# Patient Record
Sex: Female | Born: 1995
Health system: Southern US, Community
[De-identification: ages and names within clinical notes are randomized; demographics above are authoritative.]

## PROBLEM LIST (undated history)

## (undated) DIAGNOSIS — A599 Trichomoniasis, unspecified: Secondary | ICD-10-CM

## (undated) DIAGNOSIS — Z789 Other specified health status: Secondary | ICD-10-CM

## (undated) HISTORY — PX: NO PAST SURGERIES: SHX2092

---
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
## 2019-06-13 NOTE — L&D Delivery Note (Signed)
Delivery Note At 6:08 AM a viable female was delivered via Vaginal, Spontaneous (Presentation: Middle Occiput Posterior).  No nuchal cord noted. APGAR: 5, 7; weight 6 lb 6.6 oz (2909 g).   Placenta status: Spontaneous, Intact.  Cord: 3 vessels with the following complications: None.   Anesthesia: Epidural Episiotomy: None Lacerations: 1st degree Suture Repair: 3.0 Est. Blood Loss (mL): 100  Mom to postpartum.  Baby to Couplet care / Skin to Skin.  [name] 05/10/2020, 8:05 PM

## 2019-10-03 ENCOUNTER — Other Ambulatory Visit: Payer: Self-pay

## 2019-10-03 ENCOUNTER — Emergency Department (HOSPITAL_COMMUNITY)
Admission: EM | Admit: 2019-10-03 | Discharge: 2019-10-03 | Disposition: A | Discharge status: Home / Self Care | Payer: Self-pay | Attending: Emergency Medicine | Admitting: Emergency Medicine

## 2019-10-03 ENCOUNTER — Encounter (HOSPITAL_COMMUNITY): Payer: Self-pay

## 2019-10-03 DIAGNOSIS — O219 Vomiting of pregnancy, unspecified: Secondary | ICD-10-CM | POA: Insufficient documentation

## 2019-10-03 DIAGNOSIS — Z5321 Procedure and treatment not carried out due to patient leaving prior to being seen by health care provider: Secondary | ICD-10-CM | POA: Insufficient documentation

## 2019-10-03 DIAGNOSIS — Z3A00 Weeks of gestation of pregnancy not specified: Secondary | ICD-10-CM | POA: Insufficient documentation

## 2019-10-03 MED ORDER — SODIUM CHLORIDE 0.9% FLUSH: 3.0000 mL | Freq: Once | INTRAVENOUS | Status: DC

## 2019-10-03 NOTE — ED Triage Notes (Signed)
Patient states she has been vomiting x 1 month. Patient states she has lost 20 lbs in 9 days. Patient states she has not had a home pregnancy test and states she knows she is pregnant, but has not seen an OB/GYN.

## 2019-10-04 ENCOUNTER — Other Ambulatory Visit: Payer: Self-pay

## 2019-10-04 ENCOUNTER — Emergency Department (HOSPITAL_COMMUNITY)
Admission: EM | Admit: 2019-10-04 | Discharge: 2019-10-04 | Disposition: A | Discharge status: Home / Self Care | Payer: Self-pay | Attending: Emergency Medicine | Admitting: Emergency Medicine

## 2019-10-04 ENCOUNTER — Emergency Department (HOSPITAL_COMMUNITY): Payer: Self-pay

## 2019-10-04 ENCOUNTER — Encounter (HOSPITAL_COMMUNITY): Payer: Self-pay

## 2019-10-04 DIAGNOSIS — Z3A09 9 weeks gestation of pregnancy: Secondary | ICD-10-CM | POA: Insufficient documentation

## 2019-10-04 DIAGNOSIS — N898 Other specified noninflammatory disorders of vagina: Secondary | ICD-10-CM | POA: Insufficient documentation

## 2019-10-04 DIAGNOSIS — O219 Vomiting of pregnancy, unspecified: Secondary | ICD-10-CM | POA: Insufficient documentation

## 2019-10-04 DIAGNOSIS — R109 Unspecified abdominal pain: Secondary | ICD-10-CM | POA: Insufficient documentation

## 2019-10-04 LAB — CBC
HCT: 44 % (ref 36.0–46.0)
Hemoglobin: 15 g/dL (ref 12.0–15.0)
MCH: 31.6 pg (ref 26.0–34.0)
MCHC: 34.1 g/dL (ref 30.0–36.0)
MCV: 92.8 fL (ref 80.0–100.0)
Platelets: 288 10*3/uL (ref 150–400)
RBC: 4.74 MIL/uL (ref 3.87–5.11)
RDW: 11.8 % (ref 11.5–15.5)
WBC: 7.8 10*3/uL (ref 4.0–10.5)
nRBC: 0 % (ref 0.0–0.2)

## 2019-10-04 LAB — URINALYSIS, ROUTINE W REFLEX MICROSCOPIC
Bilirubin Urine: NEGATIVE
Glucose, UA: NEGATIVE mg/dL
Hgb urine dipstick: NEGATIVE
Ketones, ur: 20 mg/dL — AB
Nitrite: NEGATIVE
Protein, ur: 100 mg/dL — AB
Specific Gravity, Urine: 1.033 — ABNORMAL HIGH (ref 1.005–1.030)
Squamous Epithelial / HPF: >50 — ABNORMAL HIGH (ref 0–5)
pH: 6 (ref 5.0–8.0)

## 2019-10-04 LAB — COMPREHENSIVE METABOLIC PANEL
ALT: 24 U/L (ref 0–44)
AST: 23 U/L (ref 15–41)
Albumin: 4.2 g/dL (ref 3.5–5.0)
Alkaline Phosphatase: 66 U/L (ref 38–126)
Anion gap: 9 (ref 5–15)
BUN: 8 mg/dL (ref 6–20)
CO2: 23 mmol/L (ref 22–32)
Calcium: 9.4 mg/dL (ref 8.9–10.3)
Chloride: 102 mmol/L (ref 98–111)
Creatinine, Ser: 0.67 mg/dL (ref 0.44–1.00)
GFR calc Af Amer: >60 mL/min (ref >60)
GFR calc non Af Amer: >60 mL/min (ref >60)
Glucose, Bld: 101 mg/dL — ABNORMAL HIGH (ref 70–99)
Potassium: 3.3 mmol/L — ABNORMAL LOW (ref 3.5–5.1)
Sodium: 134 mmol/L — ABNORMAL LOW (ref 135–145)
Total Bilirubin: 1.5 mg/dL — ABNORMAL HIGH (ref 0.3–1.2)
Total Protein: 8.2 g/dL — ABNORMAL HIGH (ref 6.5–8.1)

## 2019-10-04 LAB — WET PREP, GENITAL
Sperm: NONE SEEN
Trich, Wet Prep: NONE SEEN
Yeast Wet Prep HPF POC: NONE SEEN

## 2019-10-04 LAB — LIPASE, BLOOD: Lipase: 26 U/L (ref 11–51)

## 2019-10-04 LAB — I-STAT BETA HCG BLOOD, ED (MC, WL, AP ONLY): I-stat hCG, quantitative: >2000 m[IU]/mL — ABNORMAL HIGH (ref <5)

## 2019-10-04 MED ORDER — SODIUM CHLORIDE 0.9% FLUSH: 3.0000 mL | Freq: Once | INTRAVENOUS | Status: DC

## 2019-10-04 MED ORDER — POTASSIUM CHLORIDE CRYS ER 20 MEQ PO TBCR
40.0000 meq | EXTENDED_RELEASE_TABLET | Freq: Once | ORAL | Status: AC
  Administered 2019-10-04: 40 meq via ORAL
  Filled 2019-10-04: qty 2

## 2019-10-04 MED ORDER — METOCLOPRAMIDE HCL 5 MG/ML IJ SOLN
10.0000 mg | Freq: Once | INTRAMUSCULAR | Status: AC
  Administered 2019-10-04: 10 mg via INTRAVENOUS
  Filled 2019-10-04: qty 2

## 2019-10-04 MED ORDER — DIPHENHYDRAMINE HCL 50 MG/ML IJ SOLN
25.0000 mg | Freq: Once | INTRAMUSCULAR | Status: AC
  Administered 2019-10-04: 25 mg via INTRAVENOUS
  Filled 2019-10-04: qty 1

## 2019-10-04 MED ORDER — SODIUM CHLORIDE 0.9 % IV BOLUS
1000.0000 mL | Freq: Once | INTRAVENOUS | Status: AC
  Administered 2019-10-04: 1000 mL via INTRAVENOUS

## 2019-10-04 MED ORDER — ONDANSETRON 4 MG PO TBDP: 4.0000 mg | ORAL_TABLET | Freq: Once | ORAL | Status: DC | PRN

## 2019-10-04 NOTE — ED Triage Notes (Signed)
Patient reports she is pregnant, but unsure how far along, does not remember lmp dates. Patient states she found out at the beginning of this month about pregnancy and has been throwing up every day since. Patient states she is unable to keep anything down.

## 2019-10-04 NOTE — ED Provider Notes (Signed)
[facility] DEPT Provider Note   CSN: [id number] Arrival date & time: 10/04/19  4259     History Chief Complaint  Patient presents with  . Emesis During Pregnancy    Tiffany Fitzpatrick is a 24 y.o. female.  24 y.o female with no PMH presents to the ED with a chief complaint of emesis x 24 days. According to patient she's had several episodes of non biliuos, non bloody emesis for the past month, she was able to eat hard boiled eggs last night but these came up. She reports taking a pregnancy test last week which was positive. She reports her LMP was sometime around valentines day, but this is not out of the ordinary for her as her periods are irregular. She also endorses lower abdominal stabbing pain.  She has tried taking some OTC medication for nausea without improvement. No vaginal bleeding, no vaginal discharge, no urinary symptoms, no bowel problems.  Of note, patient does report smoking tobacco, no alcohol use. This is her first pregnancy.  The history is provided by the patient and medical records.  Emesis Severity:  Severe Duration:  24 days Timing:  Constant Number of daily episodes:  15 Able to tolerate:  Solids and liquids How soon after eating does vomiting occur:  5 minutes Progression:  Worsening Chronicity:  New Ineffective treatments:  Antiemetics and liquids Associated symptoms: abdominal pain   Associated symptoms: no diarrhea, no fever, no headaches and no sore throat   Risk factors: pregnant   Risk factors: no alcohol use        No past medical history on file.  There are no problems to display for this patient.   No past surgical history on file.   OB History    Gravida  1   Para      Term      Preterm      AB      Living        SAB      TAB      Ectopic      Multiple      Live Births              Family History  Family history unknown: Yes    Social History   Tobacco Use  .  Smoking status: Never Smoker  . Smokeless tobacco: Never Used  Substance Use Topics  . Alcohol use: Never  . Drug use: Yes    Types: Marijuana    Home Medications Prior to Admission medications   Not on File    Allergies    Patient has no known allergies.  Review of Systems   Review of Systems  Constitutional: Negative for fever.  HENT: Negative for sore throat.   Respiratory: Negative for shortness of breath.   Cardiovascular: Negative for chest pain.  Gastrointestinal: Positive for abdominal pain, nausea and vomiting. Negative for diarrhea.  Genitourinary: Negative for flank pain.  Musculoskeletal: Negative for back pain.  Skin: Negative for pallor and wound.  Neurological: Negative for light-headedness and headaches.    Physical Exam Updated Vital Signs BP 115/76   Pulse 81   Temp 99.3 F (37.4 C) (Oral)   Resp 16   Ht 5\' 5"  (1.651 m)   Wt 79.4 kg   LMP  (LMP Unknown)   SpO2 99%   BMI 29.12 kg/m   Physical Exam Vitals and nursing note reviewed. Exam conducted with a chaperone present.  Constitutional:  Appearance: Normal appearance.  HENT:     Head: Normocephalic and atraumatic.     Nose: Nose normal.     Mouth/Throat:     Mouth: Mucous membranes are moist.  Eyes:     Pupils: Pupils are equal, round, and reactive to light.  Cardiovascular:     Rate and Rhythm: Normal rate.  Pulmonary:     Effort: Pulmonary effort is normal.     Breath sounds: No wheezing or rales.  Abdominal:     General: Abdomen is flat.     Palpations: Abdomen is soft.     Tenderness: There is abdominal tenderness. There is no right CVA tenderness, left CVA tenderness, guarding or rebound.  Genitourinary:    Exam position: Supine.     Pubic Area: No rash or pubic lice.      Labia:        Right: No tenderness or lesion.        Left: No tenderness or lesion.      Comments: Chaperoned by Karma Ganja RN Thick white discharge in vaginal vault, foul odor noted. No strawberry cervix.   Musculoskeletal:     Cervical back: Normal range of motion and neck supple.  Skin:    General: Skin is warm and dry.  Neurological:     Mental Status: She is alert and oriented to person, place, and time.     Motor: Weakness:      ED Results / Procedures / Treatments   Labs (all labs ordered are listed, but only abnormal results are displayed) Labs Reviewed  WET PREP, GENITAL - Abnormal; Notable for the following components:      Result Value   Clue Cells Wet Prep HPF POC PRESENT (*)    WBC, Wet Prep HPF POC MANY (*)    All other components within normal limits  COMPREHENSIVE METABOLIC PANEL - Abnormal; Notable for the following components:   Sodium 134 (*)    Potassium 3.3 (*)    Glucose, Bld 101 (*)    Total Protein 8.2 (*)    Total Bilirubin 1.5 (*)    All other components within normal limits  URINALYSIS, ROUTINE W REFLEX MICROSCOPIC - Abnormal; Notable for the following components:   Color, Urine AMBER (*)    APPearance CLOUDY (*)    Specific Gravity, Urine 1.033 (*)    Ketones, ur 20 (*)    Protein, ur 100 (*)    Leukocytes,Ua LARGE (*)    Bacteria, UA MANY (*)    Squamous Epithelial / LPF >50 (*)    Non Squamous Epithelial 0-5 (*)    All other components within normal limits  I-STAT BETA HCG BLOOD, ED ([facility], [facility], [facility] ONLY) - Abnormal; Notable for the following components:   I-stat hCG, quantitative >2,000.0 (*)    All other components within normal limits  URINE CULTURE  LIPASE, BLOOD  CBC  GC/CHLAMYDIA PROBE AMP ([place]) NOT AT [facility]    EKG None  Radiology US OB Comp < 14 Wks  Result Date: 10/04/2019 CLINICAL DATA:  Rule out ectopic pregnancy. Pregnant patient with pain. EXAM: OBSTETRIC <14 WK ULTRASOUND TECHNIQUE: Transabdominal ultrasound was performed for evaluation of the gestation as well as the maternal uterus and adnexal regions. COMPARISON:  None. FINDINGS: Intrauterine gestational sac: Single Yolk sac:  Visualized. Embryo:  Visualized. Cardiac  Activity: Visualized. Heart Rate: 162 bpm MSD:    mm    w     d CRL:   28.7 mm  9 w 5 d                  [place] [facility]: May 03, 2020 Subchorionic hemorrhage:  There is a small subchorionic hemorrhage. Maternal uterus/adnexae: The left ovary is normal. There is a thick walled cyst in the right ovary. IMPRESSION: 1. Single live IUP. 2. There is a thick walled 1.9 cm cyst in the right ovary, likely a corpus luteum cyst or hemorrhagic cyst given the presence of an IUP. Electronically Signed   By: Gerome Sam III M.D   On: 10/04/2019 14:08    Procedures Procedures (including critical care time)  Medications Ordered in ED Medications  metoCLOPramide (REGLAN) injection 10 mg (10 mg Intravenous Given 10/04/19 1148)  diphenhydrAMINE (BENADRYL) injection 25 mg (25 mg Intravenous Given 10/04/19 1148)  sodium chloride 0.9 % bolus 1,000 mL (0 mLs Intravenous Stopped 10/04/19 1415)  potassium chloride SA (KLOR-CON) CR tablet 40 mEq (40 mEq Oral Given 10/04/19 1412)    ED Course  I have reviewed the triage vital signs and the nursing notes.  Pertinent labs & imaging results that were available during my care of the patient were reviewed by me and considered in my medical decision making (see chart for details).  Clinical Course as of Oct 04 1522  Sat Oct 04, 2019  1229 Bacteria, UA(!): MANY [JS]  1229 Squamous Epithelial / LPF(!): >50 [JS]  1230 WBC, UA: 21-50 [JS]  1230 Leukocytes,Ua(!): LARGE [JS]  1504 Clue Cells Wet Prep HPF POC(!): PRESENT [JS]  1505 WBC, Wet Prep HPF POC(!): MANY [JS]    Clinical Course User Index [JS] Claude Manges, PA-C   MDM Rules/Calculators/A&P  Patient with no past medical history presents to the ED with complaints of nonbilious, nonbloody emesis for the past month.  Has taken a pregnancy test last week which was positive in nature.  Does not have any fever, urinary symptoms, vaginal bleeding, vaginal discharge.  Patient currently smoking tobacco, denies any alcohol use.   This is patient's first pregnancy, she has not had any OB care at this time.  Arrived in the ED with stable vital signs, slight elevation in heart rate, she is actively vomiting during our interview.  She has tried Gatorade along with over-the-counter medication without improvement in her symptoms. Last menstrual period sometime around Valentine's Day, reports this was normal for her as her periods tend to be abnormal and skip months.  Patient was given Reglan, Benadryl, IV fluids to help with symptomatic control.  CMP remarkable for some hypokalemia, likely coming from the multiple episodes of emesis.  Creatinine levels within normal limits.  LFTs are unremarkable.  CBC without any leukocytosis, hemoglobin is within normal limits with no signs of anemia.  UA is remarkable for large leukocytes, many bacteria, 21-50 white blood cell count, she does not have urinary symptoms on today's visit.  However, in the setting of pregnancy we will treat patient for UTI.  Wound culture has been sent, lipase is within normal limits.  Pelvic exam chaperoned with Carly RN at the bedside, thick white discharge noted on vaginal vault, foul odor noted.  No CMT, adnexal tenderness.  Ultrasound pelvis was ordered which showed: Single live IUP,9 w 5 d         [place] [facility]: May 03, 2020 Heartbeat around 160s, these results were discussed with patient at length.  A sample for GC along with wet prep was collected during pelvic exam.  Patient is awaiting results.  Nausea has resolved,  she has completed p.o. challenge successfully, is currently eating crackers and drinking Gatorade.  Wet prep remarkable for clue cells are present, many wbc. She is asymptomatic at this time. Will have patient follow up with OBGYN, a GC culture was sent patient is aware results will be back within 3 days. She was provided with a follow-up with women's health, numbers attached to her chart. She was also given instructions on B6 along with the  nighttime treatment to help with her nausea during pregnancy. Return precautions discussed at length. Patient stable for discharge.   Portions of this note were generated with Scientist, clinical (histocompatibility and immunogenetics). Dictation errors may occur despite best attempts at proofreading.  Final Clinical Impression(s) / ED Diagnoses Final diagnoses:  [redacted] weeks gestation of pregnancy  Nausea and vomiting during pregnancy    Rx / DC Orders ED Discharge Orders    None       Claude Manges, PA-C 10/04/19 1524    Benjiman Core, MD 10/05/19 1451

## 2019-10-04 NOTE — Discharge Instructions (Addendum)
According to your ultrasound you are 9 weeks and 5 days pregnant, with an estimated delivery date of May 03, 2020.   The results of your urine were contaminated we discussed waiting for treatment unless you develop symptoms.  A GC culture was sent, you will be called if this is positive.    You  may purchase some over the counter B6 (25 mg-50 mg) to help with nausea every 6 hours,MAX. 200 daily.   You may also purchase Unisom 25 mg tablets. Be aware unisom is a sleeping medication therefore should only be taken at night.

## 2019-10-04 NOTE — ED Notes (Signed)
Pt given water and crackers and able to keep them down without any difficulty.

## 2019-10-05 LAB — URINE CULTURE: Culture: NO GROWTH

## 2019-10-06 LAB — GC/CHLAMYDIA PROBE AMP (~~LOC~~) NOT AT ARMC
Chlamydia: NEGATIVE
Comment: NEGATIVE
Comment: NORMAL
Neisseria Gonorrhea: NEGATIVE

## 2019-11-19 ENCOUNTER — Ambulatory Visit (INDEPENDENT_AMBULATORY_CARE_PROVIDER_SITE_OTHER): Payer: Self-pay | Admitting: *Deleted

## 2019-11-19 ENCOUNTER — Other Ambulatory Visit: Payer: Self-pay

## 2019-11-19 DIAGNOSIS — F419 Anxiety disorder, unspecified: Secondary | ICD-10-CM

## 2019-11-19 DIAGNOSIS — Z3A00 Weeks of gestation of pregnancy not specified: Secondary | ICD-10-CM

## 2019-11-19 DIAGNOSIS — Z3490 Encounter for supervision of normal pregnancy, unspecified, unspecified trimester: Secondary | ICD-10-CM

## 2019-11-19 NOTE — Patient Instructions (Signed)
[place] [facility]  Department of Social Services[place] [address], [place], [place] [address] [phone]   or  www.[place].gov/[place]-county/human-services/social-services **SNAP/EBT/ Other nutritional benefits  [place] DHHS-Public Health-WIC [address], [place], [place] [address] [phone]) [phone]  or  https://[place]nc.gov/our-county/human-services/health-department **WIC for  women who are pregnant and postpartum, infants and children up to 5 years old  [facility] [address], [place], [place] [address] [phone]) [phone]   or   www.theblessedtable.org  **Food pantry  [facility] [address], [place], [place] [address] [phone]   or   https://brotherkolbes.godaddysites.com  **Emergency food and prepared meals  [facility] [address], [place], [place] [address] [phone]   or   www.cedargrovetop.us **Food pantry  [facility] [address], [place], [place] [address] [phone]   or   www.facebook.com/[facility]-116430931718202 **Food pantry  [facility] [address], [place], [place] [address] [phone] **Food pantry  [facility] [address], [place], [place] [address] [phone]) [phone]   or   www.greensborourbanministry.org  **Food pantry and prepared meals  [facility] [address], [place], [place] [address] https://jfsgreensboro.org/  **Food pantry  [facility] [address], [place], [place] [address] [phone]) [phone]   or   www.lbcnow.org  **Food pantry  [facility] [address], [place], [place] [address] [phone]) [phone]   or   http://www.onestepfurther.com **Food pantry, nutrition education, gardening activities  [facility] [address], [place], [place] [address] [phone] **Food pantry  [facility] [place] [address], [place], [place] [address] [phone]) [phone]   or   www.salvationarmyofgreensboro.org **Food pantry  [facility] [place] [address], [place], [place] [address] [phone]) [phone]   or   http://senior-resources-guilford.org **Meals on Wheels Program  [facility] [address], [place], [place] [address] [phone]) [phone]   or   www.stmattchurch.com  **Food pantry  [facility] [address], , [place] [address] [phone]   or   vandaliapresbyterianchurch.org **Food pantry  [place] [facility]  [facility] [facility] [address], [place], [place] [address] [phone]) [phone]   or   www.facebook.com/JulianUMC Food pantry  [facility] [address], [place], [place] [address] [phone]) [phone] **Food pantry  [place] [facility]   Department of Public Health[place] [address], [place], [place] [address] [phone]   or   www.co.Spencer.Boligee.us/ph/  [place] DHHS-Public Health-WIC ([place]) [address], [place], [place] [address] [phone]) [phone]   or   https://www.[place]nc.gov/our-county/human-services/health-department **WIC for pregnant and postpartum women, infants and children up to 5 years old  [facility] [address], [place], [place] [address] [phone]) [phone] **Food pantry  [facility] [address] [place] [address], [place], [place] [address] [phone]   or   emerywoodbaptistchurch.com *Food pantry  [facility] [address], [place], [place] [address] [phone]   or   www.fcchighpoint.org **Food pantry  [facility] [address], [place],  [address] [phone]) [phone]   or    www.helpinghandshp.org **Food pantry  [facility] [address], [place],  [address] [phone]) [phone]   or   www.facebook.com/KBCI1 **Food Pantry  [facility] [address] [place] [address] [facility]   [address] [place], [place] [address] [phone]   or   www.abbottscreek.org [employer] of [place] [phone]   **Delivers meals  New Beginnings Full [facility] [address], [place], [place] [address] [phone]   or   nbfgm.sundaystreamwebsites.com  Programme researcher, broadcasting/film/video of [employer] [address] [place], [place] [address] [phone]   or   www.odm-hp.org  **Food pantry  [place] [address], [place], [place] [address] [phone]   or   R2live.tv **Food pantry  [place] [address], [place], [place] [address] [phone]   or   ClubMonetize.fr **Emergency food and pet food  [facility] [place] [address], [place], [place] [address] [phone]   or   www.senioradults.org **Congregate and delivered meals to older adults  [place] [facility] [employer] [address], [place], [place] [address] [phone]   or   SamedayNews.com.cy **Back Pack Program for elementary school students  [place] [address], [place], [place] [address] [phone]   or   www.wardstreetcommunityresources.org **Food pantry  [facility] [address] [place], [place] [address] [phone]   or   https://www.carlson.net/ **Emergency food, nutrition classes, food budgeting  [facility] Resources [place]  [address] [place], [place], [place] [address] [phone]  or   www.co.[place].us/pview.aspx?id=14850&catid=407 **SNAP/Other nutrition benefits  [place], [place], [place] [address] [phone]   or  http://goodwin-walker.biz/  **SNAP/Other nutrition benefits  [place], [place], [place] [address] [phone]  or  http://www.rockinghamcountypublichealth.org **WIC for pregnant and postpartum women, infants and children up to 69 years old  Aging, Disability and [place] [address] [place], [place] [address] [phone]  or www.risodejaneiro.com **Prepared meals for older adults  [place] [address], [place], [place] [address] [phone]  or  NetworkingMixer.com.ee **Food pantry  [facility] [address], [place], [place] [address] [phone]   or   https://www.handsofgod.org/  Insurance underwriter  [facility] [place] [address], [place], [place] [address] [phone] **Food pantry  [facility] for [place], [place], [place] [address] [phone]   or   www.ci.[place].us/government/parks_and_recreation/senior_center/index.php **Congregate meal for older adults  [facility] [address] [place], [place] [address] [phone]   or www.reidsvilleoutreachcenter.org  **Food pantry  [facility] [address], [place], [place] [address] [phone]   or   http://reid-chang.com/ Games developer Resources [place], [place], [place] [address] https://www.[place].gov/departments/transportation/gdot-divisions/[place]-transit-agency-public-transportation-division     . Fixed-route bus services, including regional fare cards for PART, [place], [place], and WSTA buses.  . Reduced fare bus ID's available for Medicaid, Medicare, and "orange card"  recipients.  [place] [facility] SCAT offers curb-to-curb and door-to-door bus services for people with disabilities who are unable to use a fixed-bus route; also offers a shared-ride program.   Helpful tips:  -Routes available online and physical maps available at the main bus hub lobby (each for a specific route) -Smartphone directions often include bus routes (see the "bus" icon, next to the "car" and "walk" icons) -Routes differ on weekends, evenings and holidays,  so plan ahead!  -If you have Medicaid, Medicare, or orange card, plan to obtain a reduced-fare ID to save 50% on rides. Check days and times to obtain an ID, and bring all necessary documents.   [employer] System [place]) [address] [place], [place]. [address], [place], [place] [address] [phone] SpotApps.nl **Fixed-bus route services, and demand response bus service for older adults  Department of Social [facility] [address], [place], [place] [address] [phone] www.MysterySinger.com.cy **Medicaid transportation is available to [facility] recipients who need assistance getting to [facility] medical appointments and providers  [facility] [address], [place], [place] [address] www.cjmedicaltransportation.com  ** Offers non-emergency transportation for medical appointments  [address] [place], [place] [address] [phone] www.wheels4hope.org **REFERRAL NEEDED by specific agencies (see website), after meeting specified criteria only  [employer] for [employer]) [address], [place], [place] [address] [phone]  BuyingShow.es  *Regional fixed-bus routes between counties (example: [place] to [place]) and [employer] of [place]  [address], [place], [place] [address] [phone] Http://senior-resources-[place].org  Museum/gallery exhibitions officer available (call or see website for details)  Senior Adults Association[facility] [address] [place], [place] [address] [phone] www.senioradults.org  **Ride coordination    Transportation Services [place]  Aging, Disability and Transit [facility] [address] [place], [place] [address] [phone] www.adtsrc.[facility] Department of Health and [facility] [address], [place], [place] [address] [phone]  FutureSponsors.be  **Transportation expense assistance  Department of Social [place] [address], [place], [place] [address] [phone] www.co.[place].us/pview.aspx?id=14850&catid=407  **Medicaid transportation for recipients who need assistance getting to [facility] medical appointments and providers    At [facility] [name], we work as an integrated team, providing care to address both physical and emotional health. Your medical provider may refer you to see our Behavioral Health Clinician [facility]) on the same day you see your medical provider, as availability permits.  Our [facility] is available to all patients, visits generally last between 20-30 minutes, but can be longer or shorter, depending on patient need. The [facility] offers help with stress management, coping with symptoms of depression and anxiety, major life changes , sleep issues, changing risky behavior, grief and loss, life stress, working on personal life goals, and  behavioral health issues, as these all affect your overall health and wellness.  The [place] [facility] is NOT available for the following: court-ordered evaluations, specialty assessments (custody or disability), letters to employers, or obtaining certification for an emotional support animal. The [facility] does not provide long-term therapeutic services. You have the right to refuse  integrated behavioral health services, or to reschedule to see the [facility] at a later date.  Exception: If you are having thoughts of suicide, we require that you either see the [facility] for further assessment, or contract for safety with your medical provider prior to checking out.  Confidentiality exception: If it is suspected that a child or disabled adult is being abused or neglected, we are required by law to report that to either Child Protective Services or Adult Management consultant.  If you have a diagnosis of Bipolar affective disorder, Schizophrenia, or recurrent Major depressive disorder, we will recommend that you establish care with a psychiatrist, as these are lifelong, chronic conditions, and we want your overall emotional health and medications to be more closely monitored. If you anticipate needing extended maternity leave due to mental illness, it  it recommended that you find a psychiatrist as soon as possible. Neither the medical provider, nor the [facility], can recommend an extended maternity leave for mental health issues. Your medical provider or [facility] may refer you to a therapist for ongoing, traditional therapy, or to a psychiatrist, for medication management, if it would benefit your overall health. Depending on your insurance, you may have a copay to see the [facility]. If you are uninsured, it is recommended that you apply for financial assistance. (Forms may be requested at the front desk for in-person visits, via MyChart, or request a form during a virtual visit).  If you see the [facility] [place] more than 6 times, you will have to complete a comprehensive clinical assessment interview with the [facility] to resume integrated services.  Any questions?    If you are in need of transportation to get to and from your appointments in our office.  You can reach Transportation Services by calling [phone] Monday - Friday  7am-6pm.

## 2019-11-19 NOTE — Progress Notes (Signed)
I connected with  Tiffany Fitzpatrick on 11/19/19 at  1:15 PM EDT by telephone and verified that I am speaking with the correct person using two identifiers.   I discussed the limitations, risks, security and privacy concerns of performing an evaluation and management service by telephone and the availability of in person appointments. I also discussed with the patient that there may be a patient responsible charge related to this service. The patient expressed understanding and agreed to proceed.  I explained I am completing her New OB Intake today. We discussed Her EDD and that it is based on early [place]. She confirms she is not sure if her period was around Valentines day as she told ED, she thinks may have been in January.   I reviewed her allergies, meds, OB History, Medical /Surgical history, and appropriate screenings.  I informed her of [facility] services and offered her a referral due to +PHQ9 and GAD7.She accepted referral and I explained registrar will contact her with an appointment.  Patient and/or legal guardian verbally consented to [facility] services about presenting concerns and psychiatric consultation as appropriate.  She also does report Food and transportation issues. I placed resources for both in her AFS and explained we have transportation assistance in [facility].   I explained I will send her the Babyscripts app and app was sent to her while on phone.   I explained we ask that she take her blood pressure weekly during her pregnancy. She does not have a cuff. She confirms she has Location manager and medicaid. I explained those insurances do not cover bp RX and asked if she can buy a blood pressure cuff if possible. She states she will look into it.  I asked her to bring the blood pressure cuff with her to her first ob appointment so we can show her how to use it. Explained  then we will have her take her blood pressure weekly and enter into the app.  I explained she  will have some visits in office and some virtually. She already has Sports coach.  I reviewed her new ob  appointment date/ time with her , our location and to wear mask, no visitors.  I explained she will have a pelvic exam, ob bloodwork, hemoglobin a1C, cbg ,pap, and  genetic testing if desired,- she is undecided about a panorama.  I scheduled an [place] at 19 weeks and gave her the appointment. She voices understanding.   [name],RN 11/19/2019  1:24 PM

## 2019-11-20 ENCOUNTER — Encounter (INDEPENDENT_AMBULATORY_CARE_PROVIDER_SITE_OTHER): Payer: Self-pay | Admitting: Obstetrics and Gynecology

## 2019-11-20 ENCOUNTER — Telehealth: Payer: Self-pay | Admitting: Obstetrics and Gynecology

## 2019-11-20 NOTE — Telephone Encounter (Signed)
Spoke with patient about her missed appointment. She stated she her axle broke, and would call when she is ready to reschedule her new OB appointment.

## 2019-11-20 NOTE — BH Specialist Note (Signed)
Pt did not arrive to video visit and someone answered the phone and said that Tiffany Fitzpatrick was not at home, and did not have another phone number, and would leave a message to have Tiffany Fitzpatrick call back; Left HIPPA-compliant message to call back Tiffany Fitzpatrick from [employer] for [name] at [facility] at  [phone] [facility] office).  ; left MyChart message for patient.    Integrated Behavioral Health via Telemedicine Video [facility]) Visit  11/20/2019 Tiffany Fitzpatrick [facility] [phone]  Rae Lips

## 2019-11-22 NOTE — Progress Notes (Signed)
Patient did not keep her appt today.  Venia Carbon I, NP 11/22/2019 9:24 AM

## 2019-11-27 ENCOUNTER — Ambulatory Visit: Payer: Self-pay | Admitting: Clinical

## 2019-11-27 DIAGNOSIS — Z91199 Patient's noncompliance with other medical treatment and regimen due to unspecified reason: Secondary | ICD-10-CM

## 2019-12-08 ENCOUNTER — Ambulatory Visit: Payer: Self-pay | Attending: Obstetrics and Gynecology

## 2020-04-27 ENCOUNTER — Encounter (HOSPITAL_COMMUNITY): Payer: Self-pay | Admitting: Obstetrics and Gynecology

## 2020-04-27 ENCOUNTER — Other Ambulatory Visit: Payer: Self-pay

## 2020-04-27 ENCOUNTER — Inpatient Hospital Stay (HOSPITAL_COMMUNITY)
Admission: AD | Admit: 2020-04-27 | Discharge: 2020-04-27 | Disposition: A | Discharge status: Home / Self Care | Payer: Self-pay | Attending: Obstetrics and Gynecology | Admitting: Obstetrics and Gynecology

## 2020-04-27 DIAGNOSIS — O471 False labor at or after 37 completed weeks of gestation: Secondary | ICD-10-CM | POA: Insufficient documentation

## 2020-04-27 DIAGNOSIS — Z3A39 39 weeks gestation of pregnancy: Secondary | ICD-10-CM | POA: Insufficient documentation

## 2020-04-27 DIAGNOSIS — F419 Anxiety disorder, unspecified: Secondary | ICD-10-CM

## 2020-04-27 DIAGNOSIS — Z3493 Encounter for supervision of normal pregnancy, unspecified, third trimester: Secondary | ICD-10-CM

## 2020-04-27 HISTORY — DX: Trichomoniasis, unspecified: A59.9

## 2020-04-27 LAB — TYPE AND SCREEN
ABO/RH(D): B POS
Antibody Screen: NEGATIVE

## 2020-04-27 LAB — DIFFERENTIAL
Abs Immature Granulocytes: 0.09 10*3/uL — ABNORMAL HIGH (ref 0.00–0.07)
Basophils Absolute: 0 10*3/uL (ref 0.0–0.1)
Basophils Relative: 0 %
Eosinophils Absolute: 0 10*3/uL (ref 0.0–0.5)
Eosinophils Relative: 0 %
Immature Granulocytes: 1 %
Lymphocytes Relative: 19 %
Lymphs Abs: 1.7 10*3/uL (ref 0.7–4.0)
Monocytes Absolute: 0.6 10*3/uL (ref 0.1–1.0)
Monocytes Relative: 7 %
Neutro Abs: 6.4 10*3/uL (ref 1.7–7.7)
Neutrophils Relative %: 73 %

## 2020-04-27 LAB — URINALYSIS, ROUTINE W REFLEX MICROSCOPIC
Bilirubin Urine: NEGATIVE
Glucose, UA: NEGATIVE mg/dL
Hgb urine dipstick: NEGATIVE
Ketones, ur: NEGATIVE mg/dL
Nitrite: NEGATIVE
Protein, ur: NEGATIVE mg/dL
Specific Gravity, Urine: 1.004 — ABNORMAL LOW (ref 1.005–1.030)
pH: 8 (ref 5.0–8.0)

## 2020-04-27 LAB — CBC
HCT: 38.8 % (ref 36.0–46.0)
Hemoglobin: 12.8 g/dL (ref 12.0–15.0)
MCH: 30.7 pg (ref 26.0–34.0)
MCHC: 33 g/dL (ref 30.0–36.0)
MCV: 93 fL (ref 80.0–100.0)
Platelets: 322 10*3/uL (ref 150–400)
RBC: 4.17 MIL/uL (ref 3.87–5.11)
RDW: 12.9 % (ref 11.5–15.5)
WBC: 8.8 10*3/uL (ref 4.0–10.5)
nRBC: 0 % (ref 0.0–0.2)

## 2020-04-27 LAB — OB RESULTS CONSOLE GBS: GBS: NEGATIVE

## 2020-04-27 LAB — HEMOGLOBIN A1C
Hgb A1c MFr Bld: 5.1 % (ref 4.8–5.6)
Mean Plasma Glucose: 99.67 mg/dL

## 2020-04-27 NOTE — ED Triage Notes (Signed)
Emergency Medicine Provider OB Triage Evaluation Note  Tiffany Fitzpatrick is a 24 y.o. female, G1P0, at [redacted]w[redacted]d gestation who presents to the emergency department with complaints of pelvic pain and pressure.  Patient reports that this pain has been going on for about 2 days but has gotten worse.  She states it feels like period cramps.  She does not think it feels like contractions per se but is not completely sure.  Has received all of her prenatal care in [place] but lives here, was hoping to make it back in [place] and time to deliver.  She has not had any complications with her pregnancy thus far aside from nausea and vomiting.  Denies any leakage of fluids or vaginal bleeding.  Review of  Systems  Positive: Pelvic cramping Negative: Vaginal bleeding, leakage of fluids  Physical Exam  BP 127/76 (BP Location: Left Arm)   Pulse (!) 104   Temp 98.3 F (36.8 C) (Oral)   Resp 16   LMP  (LMP Unknown)   SpO2 96%  General: Awake, no distress  HEENT: Atraumatic  Resp: Normal effort  Cardiac: Normal rate Abd: Gravid abdomen without palpable contractions MSK: Moves all extremities without difficulty Neuro: Speech clear  Medical Decision Making  Pt evaluated for pregnancy concern and is stable for transfer to MAU. Pt is in agreement with plan for transfer.  3:36 PM Discussed with MAU APP, Jamila, who accepts patient in transfer.  Clinical Impression   1. [redacted] weeks gestation of pregnancy        Dartha Lodge, [place] 04/27/20 1549

## 2020-04-27 NOTE — MAU Note (Signed)
Hasn't had any menstrual like cramps during preg, started having some 2 days ago.  Was getting care in [place], has not been to dr since before Halloween.  Denies problems with preg.  Denies bleeding or LOF, reports +FM

## 2020-04-27 NOTE — Discharge Instructions (Signed)
Braxton Hicks Contractions °Contractions of the uterus can occur throughout pregnancy, but they are not always a sign that you are in labor. You may have practice contractions called Braxton Hicks contractions. These false labor contractions are sometimes confused with true labor. °What are Braxton Hicks contractions? °Braxton Hicks contractions are tightening movements that occur in the muscles of the uterus before labor. Unlike true labor contractions, these contractions do not result in opening (dilation) and thinning of the cervix. Toward the end of pregnancy (32-34 weeks), Braxton Hicks contractions can happen more often and may become stronger. These contractions are sometimes difficult to tell apart from true labor because they can be very uncomfortable. You should not feel embarrassed if you go to the hospital with false labor. °Sometimes, the only way to tell if you are in true labor is for your health care provider to look for changes in the cervix. The health care provider will do a physical exam and may monitor your contractions. If you are not in true labor, the exam should show that your cervix is not dilating and your water has not broken. °If there are no other health problems associated with your pregnancy, it is completely safe for you to be sent home with false labor. You may continue to have Braxton Hicks contractions until you go into true labor. °How to tell the difference between true labor and false labor °True labor °· Contractions last 30-70 seconds. °· Contractions become very regular. °· Discomfort is usually felt in the top of the uterus, and it spreads to the lower abdomen and low back. °· Contractions do not go away with walking. °· Contractions usually become more intense and increase in frequency. °· The cervix dilates and gets thinner. °False labor °· Contractions are usually shorter and not as strong as true labor contractions. °· Contractions are usually irregular. °· Contractions  are often felt in the front of the lower abdomen and in the groin. °· Contractions may go away when you walk around or change positions while lying down. °· Contractions get weaker and are shorter-lasting as time goes on. °· The cervix usually does not dilate or become thin. °Follow these instructions at home: ° °· Take over-the-counter and prescription medicines only as told by your health care provider. °· Keep up with your usual exercises and follow other instructions from your health care provider. °· Eat and drink lightly if you think you are going into labor. °· If Braxton Hicks contractions are making you uncomfortable: °? Change your position from lying down or resting to walking, or change from walking to resting. °? Sit and rest in a tub of warm water. °? Drink enough fluid to keep your urine pale yellow. Dehydration may cause these contractions. °? Do slow and deep breathing several times an hour. °· Keep all follow-up prenatal visits as told by your health care provider. This is important. °Contact a health care provider if: °· You have a fever. °· You have continuous pain in your abdomen. °Get help right away if: °· Your contractions become stronger, more regular, and closer together. °· You have fluid leaking or gushing from your vagina. °· You pass blood-tinged mucus (bloody show). °· You have bleeding from your vagina. °· You have low back pain that you never had before. °· You feel your baby’s head pushing down and causing pelvic pressure. °· Your baby is not moving inside you as much as it used to. °Summary °· Contractions that occur before labor are   called Braxton Hicks contractions, false labor, or practice contractions. °· Braxton Hicks contractions are usually shorter, weaker, farther apart, and less regular than true labor contractions. True labor contractions usually become progressively stronger and regular, and they become more frequent. °· Manage discomfort from Braxton Hicks contractions  by changing position, resting in a warm bath, drinking plenty of water, or practicing deep breathing. °This information is not intended to replace advice given to you by your health care provider. Make sure you discuss any questions you have with your health care provider. °Document Revised: 05/11/2017 Document Reviewed: 10/12/2016 °Elsevier Patient Education © 2020 Elsevier Inc. ° °

## 2020-04-27 NOTE — MAU Provider Note (Addendum)
First Provider Initiated Contact with Patient 04/27/20 1706     S Ms. Sarajane Fambrough is a 24 y.o. G1P0 pregnant female at [redacted]w[redacted]d  who presents to MAU today with complaint of occasional contractions and pelvic pressure. She has had all her [facility] in [place] and came to [place] [employer] to be with family. Will not be going back to [place] before delivery because of her discomfort traveling with all her contractions. She denies LOF, vaginal bleeding, decreased fetal movement, fever, falls or recent illness.    O BP 126/77   Pulse 81   Temp 98.3 F (36.8 C) (Oral)   Resp 16   LMP  (LMP Unknown)   SpO2 98%  Physical Exam Vitals and nursing note reviewed.  Constitutional:      General: She is not in acute distress.    Appearance: She is well-developed and normal weight. She is not ill-appearing.  Cardiovascular:     Rate and Rhythm: Normal rate.     Heart sounds: Normal heart sounds.  Pulmonary:     Effort: Pulmonary effort is normal.  Abdominal:     Palpations: Abdomen is soft.     Tenderness: There is no abdominal tenderness.     Comments: Gravid   Genitourinary:    Vagina: Vaginal discharge present.     Cervix: No cervical motion tenderness.     Uterus: Normal.   Skin:    General: Skin is warm and dry.     Capillary Refill: Capillary refill takes less than 2 seconds.  Neurological:     Mental Status: She is alert and oriented to person, place, and time.  Psychiatric:        Mood and Affect: Mood normal.        Behavior: Behavior normal.    Results for orders placed or performed during the hospital encounter of 04/27/20 (from the past 24 hour(s))  Type and screen [facility]     Status: None   Collection Time: 04/27/20  5:55 PM  Result Value Ref Range   ABO/RH(D) B POS    Antibody Screen NEG    Sample Expiration      04/30/2020,2359 Performed at [facility], [address], [place], [place] [address]   CBC     Status: None   Collection Time: 04/27/20   5:56 PM  Result Value Ref Range   WBC 8.8 4.0 - 10.5 K/uL   RBC 4.17 3.87 - 5.11 MIL/uL   Hemoglobin 12.8 12.0 - 15.0 g/dL   HCT 48.1 36 - 46 %   MCV 93.0 80.0 - 100.0 fL   MCH 30.7 26.0 - 34.0 pg   MCHC 33.0 30.0 - 36.0 g/dL   RDW 85.6 31.4 - 97.0 %   Platelets 322 150 - 400 K/uL   nRBC 0.0 0.0 - 0.2 %  Differential     Status: Abnormal   Collection Time: 04/27/20  5:56 PM  Result Value Ref Range   Neutrophils Relative % 73 %   Neutro Abs 6.4 1.7 - 7.7 K/uL   Lymphocytes Relative 19 %   Lymphs Abs 1.7 0.7 - 4.0 K/uL   Monocytes Relative 7 %   Monocytes Absolute 0.6 0.1 - 1.0 K/uL   Eosinophils Relative 0 %   Eosinophils Absolute 0.0 0.0 - 0.5 K/uL   Basophils Relative 0 %   Basophils Absolute 0.0 0.0 - 0.1 K/uL   Immature Granulocytes 1 %   Abs Immature Granulocytes 0.09 (  H) 0.00 - 0.07 K/uL  Hemoglobin A1c     Status: None   Collection Time: 04/27/20  5:56 PM  Result Value Ref Range   Hgb A1c MFr Bld 5.1 4.8 - 5.6 %   Mean Plasma Glucose 99.67 mg/dL  Urinalysis, Routine w reflex microscopic Urine, Clean Catch     Status: Abnormal   Collection Time: 04/27/20  6:02 PM  Result Value Ref Range   Color, Urine YELLOW YELLOW   APPearance HAZY (A) CLEAR   Specific Gravity, Urine 1.004 (L) 1.005 - 1.030   pH 8.0 5.0 - 8.0   Glucose, UA NEGATIVE NEGATIVE mg/dL   Hgb urine dipstick NEGATIVE NEGATIVE   Bilirubin Urine NEGATIVE NEGATIVE   Ketones, ur NEGATIVE NEGATIVE mg/dL   Protein, ur NEGATIVE NEGATIVE mg/dL   Nitrite NEGATIVE NEGATIVE   Leukocytes,Ua MODERATE (A) NEGATIVE   RBC / HPF 0-5 0 - 5 RBC/hpf   WBC, UA 11-20 0 - 5 WBC/hpf   Bacteria, UA FEW (A) NONE SEEN   Squamous Epithelial / LPF 0-5 0 - 5   Mucus PRESENT    Trichomonas, UA PRESENT (A) NONE SEEN   Hyaline Casts, UA PRESENT    Amorphous Crystal PRESENT     A False labor OB Panel labs drawn  P Discharge from MAU in stable condition Will send message to [facility] to attempt new OB appt for pt prior to  delivery.  Preterm labor precautions given Follow up at [facility] or return to MAU in labor  Bernerd Limbo, CNM 04/27/2020 5:48 PM

## 2020-04-27 NOTE — MAU Note (Signed)
Pt left after blood work drawn

## 2020-04-28 LAB — RUBELLA SCREEN: Rubella: 1.72 index (ref >0.99)

## 2020-04-28 LAB — RPR: RPR Ser Ql: NONREACTIVE

## 2020-04-28 LAB — HIV ANTIBODY (ROUTINE TESTING W REFLEX): HIV Screen 4th Generation wRfx: NONREACTIVE

## 2020-04-28 LAB — CULTURE, BETA STREP (GROUP B ONLY)

## 2020-04-28 LAB — CULTURE, OB URINE: Culture: NO GROWTH

## 2020-04-29 LAB — CULTURE, BETA STREP (GROUP B ONLY)

## 2020-04-29 LAB — GC/CHLAMYDIA PROBE AMP (~~LOC~~) NOT AT ARMC
Chlamydia: NEGATIVE
Comment: NEGATIVE
Comment: NORMAL
Neisseria Gonorrhea: NEGATIVE

## 2020-04-29 LAB — HEPATITIS B SURFACE ANTIGEN: Hepatitis B Surface Ag: NEGATIVE — AB

## 2020-05-06 ENCOUNTER — Other Ambulatory Visit: Payer: Self-pay

## 2020-05-06 ENCOUNTER — Encounter (HOSPITAL_COMMUNITY): Payer: Self-pay | Admitting: Obstetrics & Gynecology

## 2020-05-06 ENCOUNTER — Inpatient Hospital Stay (HOSPITAL_COMMUNITY): Payer: Self-pay | Admitting: Anesthesiology

## 2020-05-06 ENCOUNTER — Inpatient Hospital Stay (HOSPITAL_COMMUNITY)
Admission: AD | Admit: 2020-05-06 | Discharge: 2020-05-09 | DRG: 807 | Disposition: A | Discharge status: Home / Self Care | Payer: Self-pay | Attending: Obstetrics & Gynecology | Admitting: Obstetrics & Gynecology

## 2020-05-06 DIAGNOSIS — Z3490 Encounter for supervision of normal pregnancy, unspecified, unspecified trimester: Secondary | ICD-10-CM

## 2020-05-06 DIAGNOSIS — Z3493 Encounter for supervision of normal pregnancy, unspecified, third trimester: Secondary | ICD-10-CM

## 2020-05-06 DIAGNOSIS — O288 Other abnormal findings on antenatal screening of mother: Secondary | ICD-10-CM

## 2020-05-06 DIAGNOSIS — F419 Anxiety disorder, unspecified: Secondary | ICD-10-CM | POA: Diagnosis present

## 2020-05-06 DIAGNOSIS — O480 Post-term pregnancy: Principal | ICD-10-CM | POA: Diagnosis present

## 2020-05-06 DIAGNOSIS — Z20822 Contact with and (suspected) exposure to covid-19: Secondary | ICD-10-CM | POA: Diagnosis present

## 2020-05-06 DIAGNOSIS — Z3A40 40 weeks gestation of pregnancy: Secondary | ICD-10-CM

## 2020-05-06 HISTORY — DX: Other specified health status: Z78.9

## 2020-05-06 LAB — TYPE AND SCREEN
ABO/RH(D): B POS
Antibody Screen: NEGATIVE

## 2020-05-06 LAB — CBC
HCT: 38 % (ref 36.0–46.0)
Hemoglobin: 12.5 g/dL (ref 12.0–15.0)
MCH: 30.9 pg (ref 26.0–34.0)
MCHC: 32.9 g/dL (ref 30.0–36.0)
MCV: 94.1 fL (ref 80.0–100.0)
Platelets: 281 10*3/uL (ref 150–400)
RBC: 4.04 MIL/uL (ref 3.87–5.11)
RDW: 13.4 % (ref 11.5–15.5)
WBC: 9.4 10*3/uL (ref 4.0–10.5)
nRBC: 0 % (ref 0.0–0.2)

## 2020-05-06 LAB — RESP PANEL BY RT-PCR (FLU A&B, COVID) ARPGX2
Influenza A by PCR: NEGATIVE
Influenza B by PCR: NEGATIVE
SARS Coronavirus 2 by RT PCR: NEGATIVE

## 2020-05-06 MED ORDER — OXYTOCIN BOLUS FROM INFUSION
333.0000 mL | Freq: Once | INTRAVENOUS | Status: AC
  Administered 2020-05-07: 333 mL via INTRAVENOUS

## 2020-05-06 MED ORDER — ONDANSETRON HCL 4 MG/2ML IJ SOLN
4.0000 mg | Freq: Four times a day (QID) | INTRAMUSCULAR | Status: DC | PRN
  Administered 2020-05-06 – 2020-05-07 (×2): 4 mg via INTRAVENOUS
  Filled 2020-05-06 (×2): qty 2

## 2020-05-06 MED ORDER — PHENYLEPHRINE 40 MCG/ML (10ML) SYRINGE FOR IV PUSH (FOR BLOOD PRESSURE SUPPORT): 80.0000 ug | PREFILLED_SYRINGE | INTRAVENOUS | Status: DC | PRN

## 2020-05-06 MED ORDER — TERBUTALINE SULFATE 1 MG/ML IJ SOLN: 0.2500 mg | Freq: Once | INTRAMUSCULAR | Status: DC | PRN

## 2020-05-06 MED ORDER — LACTATED RINGERS IV SOLN: 500.0000 mL | Freq: Once | INTRAVENOUS | Status: DC

## 2020-05-06 MED ORDER — OXYTOCIN-SODIUM CHLORIDE 30-0.9 UT/500ML-% IV SOLN
1.0000 m[IU]/min | INTRAVENOUS | Status: DC
  Administered 2020-05-06: 2 m[IU]/min via INTRAVENOUS
  Filled 2020-05-06: qty 500

## 2020-05-06 MED ORDER — LACTATED RINGERS IV SOLN: 500.0000 mL | INTRAVENOUS | Status: DC | PRN

## 2020-05-06 MED ORDER — ZOLPIDEM TARTRATE 5 MG PO TABS: 5.0000 mg | ORAL_TABLET | Freq: Every evening | ORAL | Status: DC | PRN

## 2020-05-06 MED ORDER — OXYCODONE-ACETAMINOPHEN 5-325 MG PO TABS: 2.0000 | ORAL_TABLET | ORAL | Status: DC | PRN

## 2020-05-06 MED ORDER — LACTATED RINGERS IV SOLN: INTRAVENOUS | Status: DC

## 2020-05-06 MED ORDER — EPHEDRINE 5 MG/ML INJ: 10.0000 mg | INTRAVENOUS | Status: DC | PRN

## 2020-05-06 MED ORDER — MISOPROSTOL 25 MCG QUARTER TABLET: 25.0000 ug | ORAL_TABLET | ORAL | Status: DC | PRN

## 2020-05-06 MED ORDER — LIDOCAINE HCL (PF) 1 % IJ SOLN: 30.0000 mL | INTRAMUSCULAR | Status: DC | PRN

## 2020-05-06 MED ORDER — FENTANYL-BUPIVACAINE-NACL 0.5-0.125-0.9 MG/250ML-% EP SOLN
12.0000 mL/h | EPIDURAL | Status: DC | PRN
  Filled 2020-05-06: qty 250

## 2020-05-06 MED ORDER — OXYCODONE-ACETAMINOPHEN 5-325 MG PO TABS: 1.0000 | ORAL_TABLET | ORAL | Status: DC | PRN

## 2020-05-06 MED ORDER — SODIUM CHLORIDE (PF) 0.9 % IJ SOLN
INTRAMUSCULAR | Status: DC | PRN
  Administered 2020-05-06: 12 mL/h via EPIDURAL

## 2020-05-06 MED ORDER — PHENYLEPHRINE 40 MCG/ML (10ML) SYRINGE FOR IV PUSH (FOR BLOOD PRESSURE SUPPORT)
80.0000 ug | PREFILLED_SYRINGE | INTRAVENOUS | Status: DC | PRN
  Filled 2020-05-06: qty 10

## 2020-05-06 MED ORDER — HYDROXYZINE HCL 50 MG PO TABS
50.0000 mg | ORAL_TABLET | Freq: Four times a day (QID) | ORAL | Status: DC | PRN
  Filled 2020-05-06: qty 1

## 2020-05-06 MED ORDER — FLEET ENEMA 7-19 GM/118ML RE ENEM: 1.0000 | ENEMA | Freq: Every day | RECTAL | Status: DC | PRN

## 2020-05-06 MED ORDER — FENTANYL CITRATE (PF) 100 MCG/2ML IJ SOLN
50.0000 ug | INTRAMUSCULAR | Status: DC | PRN
  Administered 2020-05-06 – 2020-05-07 (×3): 100 ug via INTRAVENOUS
  Filled 2020-05-06 (×3): qty 2

## 2020-05-06 MED ORDER — ACETAMINOPHEN 325 MG PO TABS: 650.0000 mg | ORAL_TABLET | ORAL | Status: DC | PRN

## 2020-05-06 MED ORDER — OXYTOCIN-SODIUM CHLORIDE 30-0.9 UT/500ML-% IV SOLN
2.5000 [IU]/h | INTRAVENOUS | Status: DC
  Administered 2020-05-07: 2.5 [IU]/h via INTRAVENOUS

## 2020-05-06 MED ORDER — SOD CITRATE-CITRIC ACID 500-334 MG/5ML PO SOLN: 30.0000 mL | ORAL | Status: DC | PRN

## 2020-05-06 MED ORDER — DIPHENHYDRAMINE HCL 50 MG/ML IJ SOLN: 12.5000 mg | INTRAMUSCULAR | Status: DC | PRN

## 2020-05-06 MED ORDER — LIDOCAINE HCL (PF) 1 % IJ SOLN
INTRAMUSCULAR | Status: DC | PRN
  Administered 2020-05-06: 5 mL via EPIDURAL

## 2020-05-06 NOTE — H&P (Signed)
Obstetric History and Physical  Ivyana Locey Mersereau is a 24 y.o. G1P0 with IUP at [redacted]w[redacted]d presenting for contractions. Patient states she has been having  irregular, every 7-10 minutes contractions, no vaginal bleeding, intact membranes, with active fetal movement.    Prenatal Course Source of Care: [place], no care since October except MAU visit and she had prenatal labs done. Pregnancy complications or risks: Patient Active Problem List   Diagnosis Date Noted  . Post-dates pregnancy 05/06/2020  . Supervision of low-risk pregnancy 11/19/2019  . Anxiety 11/19/2019  She plans to breastfeed She is undecided for postpartum contraception.   Prenatal labs and studies: ABO, Rh: --/--/B POS (11/16 1755) Antibody: NEG (11/16 1755) Rubella: 1.72 (11/16 1756) RPR: NON REACTIVE (11/16 1756)  HBsAg: Negative (11/16 1758)  HIV: Non Reactive (11/16 1756)  XBJ:YNWGNFAO/-- (11/16 0000) HgA1C  5.5 Genetic screening normal as per report Anatomy US normal   Past Medical History:  Diagnosis Date  . Trichomonas infection     Past Surgical History:  Procedure Laterality Date  . NO PAST SURGERIES      OB History  Gravida Para Term Preterm AB Living  1            SAB TAB Ectopic Multiple Live Births               # Outcome Date GA Lbr Len/2nd Weight Sex Delivery Anes PTL Lv  1 Current             Social History   Socioeconomic History  . Marital status: Married    Spouse name: Not on file  . Number of children: Not on file  . Years of education: Not on file  . Highest education level: Not on file  Occupational History  . Not on file  Tobacco Use  . Smoking status: Never Smoker  . Smokeless tobacco: Never Used  Vaping Use  . Vaping Use: Never used  Substance and Sexual Activity  . Alcohol use: Not Currently  . Drug use: Not Currently    Types: Marijuana    Comment: last used in May  . Sexual activity: Not Currently    Birth control/protection: None  Other Topics  Concern  . Not on file  Social History Narrative  . Not on file   Social Determinants of Health   Financial Resource Strain:   . Difficulty of Paying Living Expenses: Not on file  Food Insecurity: Food Insecurity Present  . Worried About Programme researcher, broadcasting/film/video in the Last Year: Often true  . Ran Out of Food in the Last Year: Often true  Transportation Needs: Unmet Transportation Needs  . Lack of Transportation (Medical): Yes  . Lack of Transportation (Non-Medical): Yes  Physical Activity:   . Days of Exercise per Week: Not on file  . Minutes of Exercise per Session: Not on file  Stress:   . Feeling of Stress : Not on file  Social Connections:   . Frequency of Communication with Friends and Family: Not on file  . Frequency of Social Gatherings with Friends and Family: Not on file  . Attends Religious Services: Not on file  . Active Member of Clubs or Organizations: Not on file  . Attends Banker Meetings: Not on file  . Marital Status: Not on file    Family History  Problem Relation Age of Onset  . Healthy Mother   . Healthy Father     Medications Prior to Admission  Medication  Sig Dispense Refill Last Dose  . Prenatal Vit-Fe Fumarate-FA (PRENATAL VITAMINS PO) Take 1 tablet by mouth daily.   05/06/2020 at 0900    No Known Allergies  Review of Systems: Negative except for what is mentioned in HPI.  Physical Exam: BP 115/73 (BP Location: Right Arm)   Pulse (!) 104   Temp 98.4 F (36.9 C) (Oral)   Resp 18   Wt 92.3 kg   LMP  (LMP Unknown)   SpO2 95%   BMI 33.86 kg/m  CONSTITUTIONAL: Well-developed, well-nourished female in no acute distress.  HENT:  Normocephalic, atraumatic, External right and left ear normal. Oropharynx is clear and moist EYES: Conjunctivae and EOM are normal. Pupils are equal, round, and reactive to light. No scleral icterus.  NECK: Normal range of motion, supple, no masses SKIN: Skin is warm and dry. No rash noted. Not diaphoretic.  No erythema. No pallor. NEUROLOGIC: Alert and oriented to person, place, and time. Normal reflexes, muscle tone coordination. No cranial nerve deficit noted. PSYCHIATRIC: Normal mood and affect. Normal behavior. Normal judgment and thought content. CARDIOVASCULAR: Normal heart rate noted, regular rhythm RESPIRATORY: Effort and breath sounds normal, no problems with respiration noted ABDOMEN: Soft, nontender, nondistended, gravid. MUSCULOSKELETAL: Normal range of motion. No edema and no tenderness. 2+ distal pulses.  Cervical Exam: Dilation: 3 Effacement (%): 70 Cervical Position: Posterior Presentation: Vertex Exam by:: F. Morris, RNC  FHT:  Baseline rate 140 bpm   Variability moderate  Accelerations small 10 x 10   Decelerations none Contractions: Every 7-10 mins   Pertinent Labs/Studies:   No results found for this or any previous visit (from the past 24 hour(s)).  Assessment : Zanylah Hardie is a 24 y.o. G1P0 at [redacted]w[redacted]d being admitted for induction of labor due to postdates and nonreactive NST  Plan: Labor: Induction/Augmentation as ordered as per protocol. Analgesia as needed. FWB: Reassuring fetal heart tracing.  GBS negative Delivery plan: Hopeful for vaginal delivery   Jaynie Collins, MD, FACOG Obstetrician & Gynecologist, [facility] for [name], [facility]

## 2020-05-06 NOTE — MAU Note (Signed)
Contractions woke her at 0400, now every . No bleeding or leaking.

## 2020-05-06 NOTE — Anesthesia Procedure Notes (Signed)
Epidural Patient location during procedure: OB Start time: 05/06/2020 7:17 PM End time: 05/06/2020 7:31 PM  Staffing Anesthesiologist: Trevor Iha, MD Performed: anesthesiologist   Preanesthetic Checklist Completed: patient identified, IV checked, site marked, risks and benefits discussed, surgical consent, monitors and equipment checked, pre-op evaluation and timeout performed  Epidural Patient position: sitting Prep: DuraPrep and site prepped and draped Patient monitoring: continuous pulse ox and blood pressure Approach: midline Location: L3-L4 Injection technique: LOR air  Needle:  Needle type: Tuohy  Needle gauge: 17 G Needle length: 9 cm and 9 Needle insertion depth: 7 cm Catheter type: closed end flexible Catheter size: 19 Gauge Catheter at skin depth: 12 cm Test dose: negative  Assessment Events: blood not aspirated, injection not painful, no injection resistance, no paresthesia and negative IV test  Additional Notes Patient identified. Risks/Benefits/Options discussed with patient including but not limited to bleeding, infection, nerve damage, paralysis, failed block, incomplete pain control, headache, blood pressure changes, nausea, vomiting, reactions to medication both or allergic, itching and postpartum back pain. Confirmed with bedside nurse the patient's most recent platelet count. Confirmed with patient that they are not currently taking any anticoagulation, have any bleeding history or any family history of bleeding disorders. Patient expressed understanding and wished to proceed. All questions were answered. Sterile technique was used throughout the entire procedure. Please see nursing notes for vital signs. Test dose was given through epidural needle and negative prior to continuing to dose epidural or start infusion. Warning signs of high block given to the patient including shortness of breath, tingling/numbness in hands, complete motor block, or any  concerning symptoms with instructions to call for help. Patient was given instructions on fall risk and not to get out of bed. All questions and concerns addressed with instructions to call with any issues. 1 Attempt (S) . Patient tolerated procedure well.

## 2020-05-06 NOTE — Anesthesia Preprocedure Evaluation (Addendum)
Anesthesia Evaluation  Patient identified by MRN, date of birth, ID band Patient awake    Reviewed: Allergy & Precautions, NPO status , Patient's Chart, lab work & pertinent test results  Airway Mallampati: II  TM Distance: >3 FB Neck ROM: Full    Dental no notable dental hx. (+) Teeth Intact, Dental Advisory Given   Pulmonary neg pulmonary ROS,    Pulmonary exam normal breath sounds clear to auscultation       Cardiovascular Exercise Tolerance: Good negative cardio ROS Normal cardiovascular exam Rhythm:Regular Rate:Normal     Neuro/Psych negative neurological ROS  negative psych ROS   GI/Hepatic negative GI ROS, Neg liver ROS,   Endo/Other    Renal/GU negative Renal ROS     Musculoskeletal   Abdominal   Peds  Hematology Lab Results      Component                Value               Date                      WBC                      9.4                 05/06/2020                HGB                      12.5                05/06/2020                HCT                      38.0                05/06/2020                MCV                      94.1                05/06/2020                PLT                      281                 05/06/2020              Anesthesia Other Findings   Reproductive/Obstetrics (+) Pregnancy                            Anesthesia Physical Anesthesia Plan  ASA: II  Anesthesia Plan: Epidural   Post-op Pain Management:    Induction:   PONV Risk Score and Plan:   Airway Management Planned:   Additional Equipment:   Intra-op Plan:   Post-operative Plan:   Informed Consent: I have reviewed the patients History and Physical, chart, labs and discussed the procedure including the risks, benefits and alternatives for the proposed anesthesia with the patient or authorized representative who has indicated his/her understanding and acceptance.       Plan  Discussed with:   Anesthesia  Plan Comments: (40.3 wk G1P0 for LEa)       Anesthesia Quick Evaluation

## 2020-05-06 NOTE — Progress Notes (Signed)
Labor Progress Note Tiffany Fitzpatrick is a 24 y.o. G1P0 at [redacted]w[redacted]d presented for IOL-NR NST.  S: Doing well without complaints.  O:  BP 123/75   Pulse (!) 57   Temp 98.4 F (36.9 C) (Oral)   Resp 18   Ht 5\' 5"  (1.651 m)   Wt 92.1 kg   LMP  (LMP Unknown)   SpO2 95%   BMI 33.78 kg/m  EFM: baseline 140bpm/mod variability/+ accels/intermittent early decels Toco: q1-4 minutes  CVE: Dilation: 5 Effacement (%): 50 Cervical Position: Middle Station: -2 Presentation: Vertex (Simultaneous filing. User may not have seen previous data.) Exam by:: [email or web address]   A&P: 24 y.o. G1P0 [redacted]w[redacted]d presented for IOL-NR NST. #IOL: Progressing well, continue to titrate pitocin. Risks/benefits discussed and AROM performed without difficulty. Minimal clear/blood tinged fluid, suspect patient previously SROM. #Pain: PRN  #FWB: cat 1 #GBS negative #limited/late PNC: SW consult pp  [redacted]w[redacted]d, MD 10:13 PM

## 2020-05-06 NOTE — Progress Notes (Addendum)
Labor Progress Note Tiffany Fitzpatrick is a 24 y.o. G1P0 at [redacted]w[redacted]d presented for IOL-NR NST.  S: Doing well without complaints.  O:  BP 133/70   Pulse 88   Temp 98.4 F (36.9 C) (Oral)   Resp 18   Ht 5\' 5"  (1.651 m)   Wt 92.1 kg   LMP  (LMP Unknown)   SpO2 95%   BMI 33.78 kg/m  EFM: baseline 130bpm/mod variability/+ accels/no decels Toco: irritable  CVE: Dilation: 3 Effacement (%): 100 Cervical Position: Middle Station: -2 Presentation: Vertex Exam by:: lee   A&P: 24 y.o. G1P0 [redacted]w[redacted]d presented for IOL-NR NST. #IOL: Progressing well, continue to titrate pitocin. AROM when able. #Pain: PRN  #FWB: cat 1 #GBS negative #limited/late PNC: SW consult pp  [redacted]w[redacted]d, MD 6:07 PM

## 2020-05-07 ENCOUNTER — Encounter (HOSPITAL_COMMUNITY): Payer: Self-pay | Admitting: Obstetrics & Gynecology

## 2020-05-07 DIAGNOSIS — O480 Post-term pregnancy: Secondary | ICD-10-CM

## 2020-05-07 DIAGNOSIS — Z3A40 40 weeks gestation of pregnancy: Secondary | ICD-10-CM

## 2020-05-07 LAB — RPR: RPR Ser Ql: NONREACTIVE

## 2020-05-07 MED ORDER — IBUPROFEN 600 MG PO TABS
600.0000 mg | ORAL_TABLET | Freq: Four times a day (QID) | ORAL | Status: DC
  Administered 2020-05-07 – 2020-05-09 (×8): 600 mg via ORAL
  Filled 2020-05-07 (×8): qty 1

## 2020-05-07 MED ORDER — BUPIVACAINE HCL (PF) 0.25 % IJ SOLN
INTRAMUSCULAR | Status: DC | PRN
  Administered 2020-05-07: 8 mL via EPIDURAL

## 2020-05-07 MED ORDER — PRENATAL MULTIVITAMIN CH
1.0000 | ORAL_TABLET | Freq: Every day | ORAL | Status: DC
  Administered 2020-05-07 – 2020-05-08 (×2): 1 via ORAL
  Filled 2020-05-07 (×2): qty 1

## 2020-05-07 MED ORDER — BENZOCAINE-MENTHOL 20-0.5 % EX AERO: 1.0000 "application " | INHALATION_SPRAY | CUTANEOUS | Status: DC | PRN

## 2020-05-07 MED ORDER — SIMETHICONE 80 MG PO CHEW: 80.0000 mg | CHEWABLE_TABLET | ORAL | Status: DC | PRN

## 2020-05-07 MED ORDER — COCONUT OIL OIL
1.0000 "application " | TOPICAL_OIL | Status: DC | PRN
  Administered 2020-05-08: 1 via TOPICAL

## 2020-05-07 MED ORDER — SENNOSIDES-DOCUSATE SODIUM 8.6-50 MG PO TABS
2.0000 | ORAL_TABLET | ORAL | Status: DC
  Administered 2020-05-08 (×2): 2 via ORAL
  Filled 2020-05-07 (×2): qty 2

## 2020-05-07 MED ORDER — ONDANSETRON HCL 4 MG/2ML IJ SOLN: 4.0000 mg | INTRAMUSCULAR | Status: DC | PRN

## 2020-05-07 MED ORDER — ONDANSETRON HCL 4 MG PO TABS: 4.0000 mg | ORAL_TABLET | ORAL | Status: DC | PRN

## 2020-05-07 MED ORDER — DIPHENHYDRAMINE HCL 25 MG PO CAPS: 25.0000 mg | ORAL_CAPSULE | Freq: Four times a day (QID) | ORAL | Status: DC | PRN

## 2020-05-07 MED ORDER — TETANUS-DIPHTH-ACELL PERTUSSIS 5-2.5-18.5 LF-MCG/0.5 IM SUSY: 0.5000 mL | PREFILLED_SYRINGE | Freq: Once | INTRAMUSCULAR | Status: DC

## 2020-05-07 MED ORDER — FENTANYL CITRATE (PF) 100 MCG/2ML IJ SOLN
INTRAMUSCULAR | Status: DC | PRN
  Administered 2020-05-07: 100 ug via EPIDURAL

## 2020-05-07 MED ORDER — WITCH HAZEL-GLYCERIN EX PADS: 1.0000 "application " | MEDICATED_PAD | CUTANEOUS | Status: DC | PRN

## 2020-05-07 MED ORDER — MEASLES, MUMPS & RUBELLA VAC IJ SOLR: 0.5000 mL | Freq: Once | INTRAMUSCULAR | Status: DC

## 2020-05-07 MED ORDER — DIBUCAINE (PERIANAL) 1 % EX OINT: 1.0000 "application " | TOPICAL_OINTMENT | CUTANEOUS | Status: DC | PRN

## 2020-05-07 MED ORDER — ACETAMINOPHEN 325 MG PO TABS: 650.0000 mg | ORAL_TABLET | ORAL | Status: DC | PRN

## 2020-05-07 NOTE — Lactation Note (Addendum)
This note was copied from a baby's chart. Lactation Consultation Note  Patient Name: Tiffany Fitzpatrick [id number] Date: 05/07/2020 Reason for consult: Initial assessment;Difficult latch;Primapara;Term  Baby Tiffany Hefel now 4 hours old.  Mom reports she does not know if she can breastfeed because she has inverted nipples. Mom reports she was told that during her pregnancy by her provider.  Mom has no breastpump for home use.  Mom reports she did not get a DEBP  because she did not know whether or not she would be able to breastfeed. Mom reports that she had positive breast changes of growth and color with pregnancy. Explained to mom it was not nipple feeding that it was breastfeeding.  Explained to mom that many times with feeding and pumping your nipples come out some. Discussed pumping and offering breastmilk in bottles as well. Explained to mom breastmilk was best for baby Tiffany no  matter how she got it. Mom asked about a nipple shield.  Mom reports that's why she wanted me to come she wanted to try a nipple shield.ASked mom if we could try without it first.  Mom agreed. Assisted with prepumping. Colostrum flows easily and nipple comes out but it does not evert at this time. However, infant latched well after a few attempts and mom reports comfort.  Infant breastfed about 5 minutes on left and fell asleep.  Nipple was slightly everted and rounded past breastfeed.  Mom wanted to try nipple shield on the right.  Showed mom how to apply nipple shield.  Infant latched on nipple shield and mom reports comfort.  However when infant let go and came off only saliva in shield and infant spitty. Put Sts with mom and went over pumping with DEBP for stimulation and to help bring nipples out.  Mom reported she needed to go to rest room.  Put infant STS with dad.  Reviewed and left [facility] breastfeeding handout. Urged to call lactation as needed.    Candice Lunney S Brittani Purdum 05/07/2020, 11:43 AM

## 2020-05-07 NOTE — Discharge Instructions (Signed)

## 2020-05-07 NOTE — Lactation Note (Signed)
This note was copied from a baby's chart. Lactation Consultation Note  Patient Name: Tiffany Fitzpatrick [id number] Date: 05/07/2020 Reason for consult: Follow-up assessment;Mother's request;Difficult latch;Primapara;1st time breastfeeding;Term  Infant is 40 weeks 13 hours old. Mom started [facility] because she was not sure she had enough BM.  Mom gave 12 ml of DBM at 1611 feeding. Mom has inverted nipples and wonders if infant can latch. She was given a NS size 20, but was not sure if infant was using it as a pacifier.   LC applied heat and breast massage, drops of colostrum collected and given to the infant. LC put infant in semi prone position with a tea cup hold. Infant able to latch with signs of milk transfer. Infant latched for 6 minutes with signs of milk transfer. Still nursing at the end of the visit.   Mom states her nipples are normally inverted and she also had in nipple rings up until she went into labor. LC provided breast shells to wear with instructions on parts, assembly and cleaning. Mom will remove breast shells when sleeping.   Plan 1. To feed infant based on cues 8-12x in 24 hour period no more than 4 hours without an attempt. Mom to place infant STS, offer both breasts and look for signs of milk transfer.           2. Mom to supplement based on breastfeeding supplementation guideline EBM or DBM based on hours of age.          3. Mom to pump q 3 hours with DEBP for 15 minutes.          4. LC alerted RN, Kristine Linea, given Mom's inverted nipples she will need assistance with latching and encouragement to pump using DEBP. Clydie Braun stated she would pass on information in sign out.          5. Mom to call RN or LC for assistance with next latch.    Tiffany Nickey  Fitzpatrick 05/07/2020, 7:13 PM

## 2020-05-07 NOTE — Discharge Summary (Addendum)
Postpartum Discharge Summary     Patient Name: Tiffany Fitzpatrick DOB: [birth year] MRN: [id number]  Date of admission: 05/06/2020 Delivery date:05/07/2020  Delivering provider: Arrie Senate  Date of discharge: 05/09/2020  Admitting diagnosis: Post-dates pregnancy [O48.0] Intrauterine pregnancy: [redacted]w[redacted]d    Secondary diagnosis:  Active Problems:   Supervision of low-risk pregnancy   Anxiety   Post-dates pregnancy   Vaginal delivery   First degree perineal laceration  Additional problems: none    Discharge diagnosis: Term Pregnancy Delivered                                              Post partum procedures:n/a Augmentation: AROM and Pitocin Complications: None  Hospital course: Induction of Labor With Vaginal Delivery   24y.o. yo G1P0 at 26w4das admitted to the hospital 05/06/2020 for induction of labor.  Indication for induction: Postdates and NR NST in MAU.  Patient had an uncomplicated labor course as follows: Membrane Rupture Time/Date: 10:05 PM ,05/06/2020   Delivery Method:Vaginal, Spontaneous  Episiotomy: None  Lacerations:  1st degree  Details of delivery can be found in separate delivery note.  Patient had a routine postpartum course. Patient is discharged home 05/09/20.  Newborn Data: Birth date:05/07/2020  Birth time:6:08 AM  Gender:Female  Living status:Living  Apgars:5 ,7  Weight:2909 g (6lb 6.6oz)  Magnesium Sulfate received: No BMZ received: No Rhophylac:N/A MMR:N/A T-DaP:Given postpartum Flu: No Transfusion:No  Physical exam  Vitals:   05/07/20 0400 05/07/20 0430 05/07/20 0500 05/07/20 0535  BP: (!) 105/51 119/61 (!) 80/52 (!) 107/56  Pulse: 80 83 82 100  Resp:      Temp:      TempSrc:      SpO2:      Weight:      Height:       General: alert, cooperative and no distress Lochia: appropriate Uterine Fundus: firm Incision: N/A DVT Evaluation: No evidence of DVT seen on physical exam. Labs: Lab Results  Component  Value Date   WBC 9.4 05/06/2020   HGB 12.5 05/06/2020   HCT 38.0 05/06/2020   MCV 94.1 05/06/2020   PLT 281 05/06/2020   CMP Latest Ref Rng & Units 10/04/2019  Glucose 70 - 99 mg/dL 101(H)  BUN 6 - 20 mg/dL 8  Creatinine 0.44 - 1.00 mg/dL 0.67  Sodium 135 - 145 mmol/L 134(L)  Potassium 3.5 - 5.1 mmol/L 3.3(L)  Chloride 98 - 111 mmol/L 102  CO2 22 - 32 mmol/L 23  Calcium 8.9 - 10.3 mg/dL 9.4  Total Protein 6.5 - 8.1 g/dL 8.2(H)  Total Bilirubin 0.3 - 1.2 mg/dL 1.5(H)  Alkaline Phos 38 - 126 U/L 66  AST 15 - 41 U/L 23  ALT 0 - 44 U/L 24   Edinburgh Score: Edinburgh Postnatal Depression Scale Screening Tool 05/08/2020  I have been able to laugh and see the funny side of things. 0  I have looked forward with enjoyment to things. 0  I have blamed myself unnecessarily when things went wrong. 0  I have been anxious or worried for no good reason. 0  I have felt scared or panicky for no good reason. 0  Things have been getting on top of me. 0  I have been so unhappy that I have had difficulty sleeping. 0  I have felt sad or miserable. 0  I have been so unhappy that I have been crying. 0  The thought of harming myself has occurred to me. 0  Edinburgh Postnatal Depression Scale Total 0     After visit meds:  Allergies as of 05/09/2020   No Known Allergies     Medication List    TAKE these medications   acetaminophen 325 MG tablet Commonly known as: Tylenol Take 2 tablets (650 mg total) by mouth every 4 (four) hours as needed (for pain scale < 4).   ibuprofen 600 MG tablet Commonly known as: ADVIL Take 1 tablet (600 mg total) by mouth every 6 (six) hours.   PRENATAL VITAMINS PO Take 1 tablet by mouth daily.        Discharge home in stable condition Infant Feeding: Breast Infant Disposition:home with mother Discharge instruction: per After Visit Summary and Postpartum booklet. Activity: Advance as tolerated. Pelvic rest for 6 weeks.  Diet: routine diet Future  Appointments:No future appointments. Follow up Visit: Message sent to [facility] 05/07/20   Please schedule this patient for a In person postpartum visit in 4 weeks with the following provider: Any provider. Additional Postpartum F/U:none  Low risk pregnancy complicated by: n/a Delivery mode:  Vaginal, Spontaneous  Anticipated Birth Control:  nuvaring-prescribe at pp appt   05/09/2020 Gerlene Fee, DO  CNM attestation I have seen and examined this patient and agree with above documentation in the resident's note.   Tiffany Fitzpatrick is a 24 y.o. G1P1001 s/p vag del.   Pain is well controlled.  Plan for birth control is unsure- had wanted Nuvaring but she understands it isn't recommended with breastfeeding.  Method of Feeding: breast  PE:  BP 119/82 (BP Location: Left Arm)   Pulse 77   Temp 98.3 F (36.8 C) (Oral)   Resp 17   Ht '5\' 5"'  (1.651 m)   Wt 92.1 kg   LMP  (LMP Unknown)   SpO2 99%   Breastfeeding Unknown   BMI 33.78 kg/m  Fundus firm  Recent Labs    05/06/20 1139  HGB 12.5  HCT 38.0     Plan: discharge today - postpartum care discussed - f/u clinic in 4 weeks for postpartum visit   Myrtis Ser, CNM 10:17 AM  05/09/2020

## 2020-05-08 NOTE — Progress Notes (Signed)
CSW acknowledged consult and attempted to meet with MOB. However, MOB was sleeping when CSW entered the room.  CSW will meet with MOB at a later time.  Kipper Buch D. Dortha Kern, MSW, LCSW Clinical Social Worker [phone]

## 2020-05-08 NOTE — Lactation Note (Signed)
This note was copied from a baby's chart. Lactation Consultation Note Baby 19 hrs old. Mom was giving Donor milk via gloved finger and curve tip syring. LC assisted in positioning mom's finger and hand for baby to suckle, as well as curve tip syring. Discussed BF, wearing NS, and wearing shells in am. Encouraged pumping. Mom stated baby biting her finger verses suckling. Mom had warmed 25 ml Donor milk going to give to baby. Discussed newborn supplementing amount according to hours of age. Explained once warm milk only has 1 hr once mom has used milk for baby. Supplemental information sheet given. Reported to RN.  Patient Name: Girl Valynn Schamberger CBULA'G Date: 05/08/2020 Reason for consult: Follow-up assessment;Primapara;Term   Maternal Data    Feeding Feeding Type: Donor Breast Milk  LATCH Score       Type of Nipple: Inverted  Comfort (Breast/Nipple): Soft / non-tender        Interventions    Lactation Tools Discussed/Used     Consult Status Consult Status: Follow-up Date: 05/08/20 Follow-up type: In-patient    Jacquelynne Guedes, Diamond Nickel 05/08/2020, 1:23 AM

## 2020-05-08 NOTE — Lactation Note (Signed)
This note was copied from a baby's chart. Lactation Consultation Note Reported to RN of consult. Discussed Donor milk, how mom was supplementing and breast care needs.  Patient Name: Tiffany Fitzpatrick AJOIN'O Date: 05/08/2020 Reason for consult: Follow-up assessment;Primapara;Term   Maternal Data    Feeding Feeding Type: Donor Breast Milk  LATCH Score       Type of Nipple: Inverted  Comfort (Breast/Nipple): Soft / non-tender        Interventions    Lactation Tools Discussed/Used     Consult Status Consult Status: Follow-up Date: 05/08/20 Follow-up type: In-patient    Ivis Nicolson, Diamond Nickel 05/08/2020, 2:09 AM

## 2020-05-08 NOTE — Lactation Note (Signed)
This note was copied from a baby's chart. Lactation Consultation Note  Patient Name: Tiffany Fitzpatrick DDUKG'U Date: 05/08/2020 Reason for consult: Follow-up assessment;Mother's request;Term P1, 35 hour term female infant -2% weight loss. Mom is breastfeeding and supplementing with donor breast milk. Per mom, she has been using heating pack on her breast due to soreness , LC advised discontinue it heating pack because it may lead to engorgement. Mom has not been using the DEBP since yesterday, per mom, she was told to pump for 1 hour and it was painful so she not used it since, [facility] notice mom was fitted with wrong flange size LC fitted mom with 27 mm breast flange and informed her she should only pump for 15 minutes on initial setting, mom verbalized she understood, LC also written instruction on mom's white board in the room. Mom asked LC why is infant wanting BF for one hour, LC explained infant may be cluster feeding. Per mom, infant is latching and she is not using the 24 mm NS she was given previously, mom had given infant 24 mls of donor breast milk prior to [facility] entering the room, LC unable to assess latch at this time.  Mom has flat nipples but was not wearing breast shells, per mom,  she doesn't have bra with her, LC made make shift bra, mom shown how to use and fitted, mom understands not to wear bra while sleeping nor at night.  LC re-fitted mom with 27 mm breast flange, per mom,  it feels more  comfortable and she was  willing to use the DEBP, as mom was pumping she had expressed 10 mls of colostrum as LC was walking out of the room. Moving forward mom will pump every 3 hours for 15 minutes on initial setting. LC informed RN to give infant coconut oil for breast soreness.  Mom's current plan for Day 2: 1. Mom will latch infant according to cues, 8 to 12+ times within 24 hours and if she needs help with latch she will call RN or LC for latch assistance. 2. LC discussed volume with parents,  mom will latch infant at breast for each feeding and based on breastfeeding and supplementation, mom will offer her EBM first that she pumped and then after offer  donor breast milk if infant needs more volume. 3. Parents understand Day 2 range is 7-12+ mls per feeding after infant latches at the breast. 4. Mom will wear breast shells during the day and not while sleeping or at night to help evert nipple shaft out more. 5. Mom will pre-pump her breast prior to latching infant at the breast.       Maternal Data Has patient been taught Hand Expression?: Yes  Feeding Feeding Type: Donor Breast Milk  LATCH Score Latch: Grasps breast easily, tongue down, lips flanged, rhythmical sucking.  Audible Swallowing: A few with stimulation  Type of Nipple: Everted at rest and after stimulation  Comfort (Breast/Nipple): Soft / non-tender  Hold (Positioning): No assistance needed to correctly position infant at breast.  LATCH Score: 9  Interventions Interventions: Skin to skin;Breast massage;Hand express;Pre-pump if needed;Expressed milk;DEBP;Hand pump;Shells  Lactation Tools Discussed/Used Tools: Shells;Pump;Flanges Flange Size: 27 Shell Type: Other (comment) (flat nipples) WIC Program: Yes ([place] but not interested in applying for [facility] here.)   Consult Status Consult Status: Follow-up Date: 05/09/20 Follow-up type: In-patient    Danelle Earthly 05/08/2020, 5:27 PM

## 2020-05-08 NOTE — Clinical Social Work Maternal (Signed)
CLINICAL SOCIAL WORK MATERNAL/CHILD NOTE  Patient Details  Name: Tiffany Fitzpatrick MRN: [id number] Date of Birth: Aug 27, 1995  Date:  05/08/2020  Clinical Social Worker Initiating Note:  Tiffany Fitzpatrick, MSW, LCSW Date/Time: Initiated:  05/08/20/1100     Child's Name:  Tiffany Fitzpatrick   Biological Parents:  Mother, Father (FOB, Tiffany Fitzpatrick, 12/18/1985, [phone])   Need for Interpreter:  None   Reason for Referral:  Current Substance Use/Substance Use During Pregnancy , Late or No Prenatal Care    Address:  [address] [place] [address]    Phone number:  [phone] (home)     Additional phone number: FOB [phone]  Household Members/Support Persons (HM/SP):   Household Member/Support Person 1   HM/SP Name Relationship DOB or Age  HM/SP -1 Tiffany Fitzpatrick FOB 12/18/1985  HM/SP -2        HM/SP -3        HM/SP -4        HM/SP -5        HM/SP -6        HM/SP -7        HM/SP -8          Natural Supports (not living in the home):  Extended Family, Immediate Family   Professional Supports: None   Employment: Unemployed   Type of Work:     Education:  [place] arranged:    Museum/gallery curator Resources:  [employer]   Other Resources:  Theatre stage manager Considerations Which May Impact Care:  none stated  Strengths:  Ability to meet basic needs , Home prepared for child , Pediatrician chosen   Psychotropic Medications:         Pediatrician:    [place]  Pediatrician List:   [facility] for [place]      Pediatrician Fax Number:    Risk Factors/Current Problems:  None   Cognitive State:    Alert and oriented x3  Mood/Affect:  Calm , Happy , Interested , Comfortable    CSW Assessment: CSW received consult for limited prenatal care and THC use during pregnancy.  CSW met with MOB at bedside to  offer support and complete assessment.  On arrival, CSW introduced self and stated visit purpose. FOB and infant Tiffany Fitzpatrick were present, however, after PPD/A and SIDS education, FOB stepped out of room to offer MOB privacy during assessment. MOB and FOB were both very pleasant and engaged during visit.   CSW provided education regarding the baby blues period vs. perinatal mood disorders, discussed treatment and gave resources for mental health follow up if concerns arise.  CSW recommends self-evaluation during the postpartum time period using the New Mom Checklist from Postpartum Progress and encouraged MOB and FOB to contact a medical professional if symptoms are noted at any time. MOB and FOB stated understanding and denied any questions.    CSW provided review of Sudden Infant Death Syndrome (SIDS) precautions.  MOB and FOB stated understanding and denied any questions. MOB confirmed having all needed items for baby including car seat and 2 bassinets for baby's safe sleep.   During assessment, MOB reported adequate prenatal care while in [place]. MOB stated she came down to visit but ended up staying. MOB confirmed THC use during pregnancy to assist with nausea and poor appetite. MOB denied any  other substance use or BH dx. MOB reported last marijuana use as Halloween. CSW informed MOB of hospital's infant drug screen policy, pending UDS and CDS results, and CPS report if warranted. MOB stated understanding and denied any questions or concerns. MOB declined substance treatment resources or referral. MOB denied any SI, HI, or domestic violence. MOB identified FOB and both sets of family as support. MOB reported current mood as happy about baby. MOB declined any additional resources or referrals.    CSW Plan/Description:  Hospital Drug Screen Policy Information, CSW Will Continue to Monitor Umbilical Cord Tissue Drug Screen Results and Make Report if Warranted, Child Protective Service Report , Sudden Infant  Death Syndrome (SIDS) Education, Perinatal Mood and Anxiety Disorder (PMADs) Education, No Further Intervention Required/No Barriers to Discharge  After visit, CSW noticed infant's THC + UDS results, therefore, contacted [facility] CPS to complete report. CPS report for [facility] + baby was completed with CPS investigator Tiffany Fitzpatrick. Key. Report was screened in for 72-hour CPS f/u in the home.  There are no barriers to discharging baby with MOB at this time.   Tiffany Fitzpatrick D. Tiffany Fitzpatrick, MSW, LCSW Clinical Social Worker [phone] 05/08/2020, 11:51 AM

## 2020-05-08 NOTE — Anesthesia Postprocedure Evaluation (Signed)
Anesthesia Post Note  Patient: Tiffany Fitzpatrick [facility] Wil[facility]  Procedure(s) Performed: AN AD HOC LABOR EPIDURAL     Patient location during evaluation: Mother Baby Anesthesia Type: Epidural Level of consciousness: awake and alert Pain management: pain level controlled Vital Signs Assessment: post-procedure vital signs reviewed and stable Respiratory status: spontaneous breathing, nonlabored ventilation and respiratory function stable Cardiovascular status: stable Postop Assessment: no headache, no backache and epidural receding Anesthetic complications: no   No complications documented.  Last Vitals:  Vitals:   05/07/20 2147 05/08/20 0646  BP: 123/80 125/89  Pulse: 61 66  Resp: 18 18  Temp: 36.7 C 36.7 C  SpO2:      Last Pain:  Vitals:   05/08/20 0751  TempSrc:   PainSc: 0-No pain   Pain Goal:                   Trevor Iha

## 2020-05-08 NOTE — Progress Notes (Addendum)
POSTPARTUM PROGRESS NOTE  Subjective: Tiffany Fitzpatrick is a 24 y.o. G1P1001 s/p SVD at [redacted]w[redacted]d.  She reports she doing well. No acute events overnight. She denies any problems with ambulating, voiding or po intake. Denies nausea or vomiting. She has passed flatus. Pain is well controlled.  Lochia is present without clots.  Objective: Blood pressure 123/80, pulse 61, temperature 98.1 F (36.7 C), temperature source Oral, resp. rate 18, height 5\' 5"  (1.651 m), weight 92.1 kg, SpO2 97 %, unknown if currently breastfeeding.  Physical Exam:  General: alert, cooperative and no distress Chest: no respiratory distress Abdomen: soft, non-tender  Uterine Fundus: firm and at level of umbilicus Extremities: No calf swelling or tenderness, no LE edema noted bilaterally   Recent Labs    05/06/20 1139  HGB 12.5  HCT 38.0    Assessment/Plan: Jalissa Heinzelman is a 24 y.o. G1P1001 s/p SVD at [redacted]w[redacted]d for IOL-NR NST.  Routine Postpartum Care: Doing well, pain well-controlled.  -- Continue routine care, lactation support  -- Contraception: nuva ring -- Feeding: breast --SW consult placed for limited prenatal care.   Dispo: Plan for discharge anticipated 11/28.  12/28, DO 05/08/2020 6:19 AM

## 2020-05-09 DIAGNOSIS — Z8759 Personal history of other complications of pregnancy, childbirth and the puerperium: Secondary | ICD-10-CM

## 2020-05-09 DIAGNOSIS — F419 Anxiety disorder, unspecified: Secondary | ICD-10-CM

## 2020-05-09 DIAGNOSIS — O99345 Other mental disorders complicating the puerperium: Secondary | ICD-10-CM

## 2020-05-09 MED ORDER — ACETAMINOPHEN 325 MG PO TABS: 650.0000 mg | ORAL_TABLET | ORAL | Status: AC | PRN

## 2020-05-09 MED ORDER — IBUPROFEN 600 MG PO TABS
600.0000 mg | ORAL_TABLET | Freq: Four times a day (QID) | ORAL | 0 refills | Status: DC
Start: 2020-05-09 — End: 2022-07-24

## 2020-05-09 NOTE — Lactation Note (Signed)
This note was copied from a baby's chart. Lactation Consultation Note  Patient Name: Tiffany Fitzpatrick HENID'P Date: 05/09/2020 Reason for consult: Follow-up assessment  P1 mother whose infant is now 64 hours old.  This is a term baby at 40+4 weeks.  Mother is breast/bottle feeding.  She is using donor breast milk.  Baby was asleep STS on father's chest when I arrived.  Mother had no questions/concerns related to breast feeding.  Engorgement prevention/treatment reviewed.    Mother plans to purchase a DEBP. Manual pump at bedside. She has our OP phone number for any concerns after discharge.   Maternal Data    Feeding Feeding Type: Breast Milk Nipple Type: Slow - flow  LATCH Score Latch: Grasps breast easily, tongue down, lips flanged, rhythmical sucking.  Audible Swallowing: A few with stimulation  Type of Nipple: Everted at rest and after stimulation  Comfort (Breast/Nipple): Soft / non-tender  Hold (Positioning): No assistance needed to correctly position infant at breast.  LATCH Score: 9  Interventions    Lactation Tools Discussed/Used     Consult Status Consult Status: Complete Date: 05/09/20 Follow-up type: Call as needed    Tiffany Fitzpatrick 05/09/2020, 10:03 AM

## 2020-05-09 NOTE — Addendum Note (Signed)
Addendum  created 05/09/20 0931 by Shelton Silvas, MD   Clinical Note Signed

## 2020-05-09 NOTE — Progress Notes (Signed)
  Anesthesia Pain Progress Note  Patient: Nethra Mehlberg Halseth, 24 y.o., female  Consult Requested by: Tereso Newcomer, MD  Reason for Consult: R Leg Weakness s/p Labor Epidural per patient, states epidural was "stronger" on right side.  Level of Consciousness: alert  Pain: none   Physical Exam:  CV: RRR Pulm: CTAB Neuro: Intact Ext: Dorsiflexion/Plantar flexion: 5/5 bilaterally, Abductoin/Adduction: 4/5 R, 5/5 on L.  Back: Epidural placement site, clean, dry, non tender, no induration or drainage  Last Vitals:  Vitals:   05/08/20 2132 05/09/20 0510  BP: 125/89 119/82  Pulse: 74 77  Resp:  17  Temp: 37.2 C 36.8 C  SpO2:  99%    No Known Allergies  I have reviewed the patient's medications listed below. Marland Kitchen ibuprofen  600 mg Oral Q6H  . measles, mumps & rubella vaccine  0.5 mL Subcutaneous Once  . prenatal multivitamin  1 tablet Oral Q1200  . senna-docusate  2 tablet Oral Q24H  . Tdap  0.5 mL Intramuscular Once    acetaminophen, benzocaine-Menthol, coconut oil, witch hazel-glycerin **AND** dibucaine, diphenhydrAMINE, ondansetron **OR** ondansetron (ZOFRAN) IV, simethicone  Past Medical History:  Diagnosis Date  . Medical history non-contributory   . Trichomonas infection    Past Surgical History:  Procedure Laterality Date  . NO PAST SURGERIES      reports that she has never smoked. She has never used smokeless tobacco. She reports previous alcohol use. She reports previous drug use. Drug: Marijuana.    Assessment/Plan:  Ms. Lopata is currently able to ambulate without assistance. Physical findings consistent with patient report of epidural being denser on the right side. She states it has improved since yesterday. Due to unilateral nature and no other sequelae, I have low concern for epidural abscess, epidural hematoma and/or neurologic injury.   At present time, I believe Ms. Norling can be discharged home from an anesthesia standpoint. I recommend  ambulation with assistance if needed until she feels her right leg strength is at baseline. She should contact anesthesia team if her perceived weakness does not continually improve over the next 24 to 48 hours.     Kaylyn Layer [place], MD, [facility] Anesthesiology

## 2020-06-08 ENCOUNTER — Encounter: Payer: Self-pay | Admitting: Obstetrics and Gynecology

## 2020-06-08 ENCOUNTER — Ambulatory Visit (INDEPENDENT_AMBULATORY_CARE_PROVIDER_SITE_OTHER): Payer: Self-pay | Admitting: Obstetrics and Gynecology

## 2020-06-08 ENCOUNTER — Other Ambulatory Visit: Payer: Self-pay

## 2020-06-08 VITALS — BP 128/90 | HR 86 | Ht 65.0 in | Wt 187.2 lb

## 2020-06-08 DIAGNOSIS — O9833 Other infections with a predominantly sexual mode of transmission complicating the puerperium: Secondary | ICD-10-CM

## 2020-06-08 DIAGNOSIS — Z3202 Encounter for pregnancy test, result negative: Secondary | ICD-10-CM

## 2020-06-08 DIAGNOSIS — Z8759 Personal history of other complications of pregnancy, childbirth and the puerperium: Secondary | ICD-10-CM

## 2020-06-08 DIAGNOSIS — A599 Trichomoniasis, unspecified: Secondary | ICD-10-CM

## 2020-06-08 LAB — POCT PREGNANCY, URINE: Preg Test, Ur: NEGATIVE

## 2020-06-08 MED ORDER — METRONIDAZOLE 500 MG PO TABS: 500.0000 mg | ORAL_TABLET | Freq: Two times a day (BID) | ORAL | 0 refills | Status: DC

## 2020-06-08 NOTE — Patient Instructions (Signed)
Trichomoniasis Trichomoniasis is an STI (sexually transmitted infection) that can affect both women and men. In women, the outer area of the female genitalia (vulva) and the vagina are affected. In men, mainly the penis is affected, but the prostate and other reproductive organs can also be involved.  This condition can be treated with medicine. It often has no symptoms (is asymptomatic), especially in men. If not treated, trichomoniasis can last for months or years. What are the causes? This condition is caused by a parasite called Trichomonas vaginalis. Trichomoniasis most often spreads from person to person (is contagious) through sexual contact. What increases the risk? The following factors may make you more likely to develop this condition:  Having unprotected sex.  Having sex with a partner who has trichomoniasis.  Having multiple sexual partners.  Having had previous trichomoniasis infections or other STIs. What are the signs or symptoms? In women, symptoms of trichomoniasis include:  Abnormal vaginal discharge that is clear, white, gray, or yellow-green and foamy and has an unusual "fishy" odor.  Itching and irritation of the vagina and vulva.  Burning or pain during urination or sex.  Redness and swelling of the genitals. In men, symptoms of trichomoniasis include:  Penile discharge that may be foamy or contain pus.  Pain in the penis. This may happen only when urinating.  Itching or irritation inside the penis.  Burning after urination or ejaculation. How is this diagnosed? In women, this condition may be found during a routine Pap test or physical exam. It may be found in men during a routine physical exam. Your health care provider may do tests to help diagnose this infection, such as:  Urine tests (men and women).  The following in women: ? Testing the pH of the vagina. ? A vaginal swab test that checks for the Trichomonas vaginalis parasite. ? Testing vaginal  secretions. Your health care provider may test you for other STIs, including HIV (human immunodeficiency virus). How is this treated? This condition is treated with medicine taken by mouth (orally), such as metronidazole or tinidazole, to fight the infection. Your sexual partner(s) also need to be tested and treated.  If you are a woman and you plan to become pregnant or think you may be pregnant, tell your health care provider right away. Some medicines that are used to treat the infection should not be taken during pregnancy. Your health care provider may recommend over-the-counter medicines or creams to help relieve itching or irritation. You may be tested for infection again 3 months after treatment. Follow these instructions at home:  Take and use over-the-counter and prescription medicines, including creams, only as told by your health care provider.  Take your antibiotic medicine as told by your health care provider. Do not stop taking the antibiotic even if you start to feel better.  Do not have sex until 7-10 days after you finish your medicine, or until your health care provider approves. Ask your health care provider when you may start to have sex again.  (Women) Do not douche or wear tampons while you have the infection.  Discuss your infection with your sexual partner(s). Make sure that your partner gets tested and treated, if necessary.  Keep all follow-up visits as told by your health care provider. This is important. How is this prevented?   Use condoms every time you have sex. Using condoms correctly and consistently can help protect against STIs.  Avoid having multiple sexual partners.  Talk with your sexual partner about any   symptoms that either of you may have, as well as any history of STIs.  Get tested for STIs and STDs (sexually transmitted diseases) before you have sex. Ask your partner to do the same.  Do not have sexual contact if you have symptoms of  trichomoniasis or another STI. Contact a health care provider if:  You still have symptoms after you finish your medicine.  You develop pain in your abdomen.  You have pain when you urinate.  You have bleeding after sex.  You develop a rash.  You feel nauseous or you vomit.  You plan to become pregnant or think you may be pregnant. Summary  Trichomoniasis is an STI (sexually transmitted infection) that can affect both women and men.  This condition often has no symptoms (is asymptomatic), especially in men.  Without treatment, this condition can last for months or years.  You should not have sex until 7-10 days after you finish your medicine, or until your health care provider approves. Ask your health care provider when you may start to have sex again.  Discuss your infection with your sexual partner(s). Make sure that your partner gets tested and treated, if necessary. This information is not intended to replace advice given to you by your health care provider. Make sure you discuss any questions you have with your health care provider. Document Revised: 03/12/2018 Document Reviewed: 03/12/2018 Elsevier Patient Education  2020 [employer] Inc[employer]  

## 2020-06-08 NOTE — Progress Notes (Signed)
Pt states having pain in lower right abdomen that comes & goes.

## 2020-06-08 NOTE — Progress Notes (Signed)
Post Partum Visit Note  Tiffany Fitzpatrick is a 24 y.o. G34P1001 female who presents for a postpartum visit. She is 4 weeks postpartum following a normal spontaneous vaginal delivery.  I have fully reviewed the prenatal and intrapartum course. The delivery was at [redacted]w[redacted]d gestational weeks.  Anesthesia: epidural. Postpartum course has been uncomplicated. Baby is doing well. Baby is feeding by both breast and bottle - Breast Milk in bottle. Bleeding no bleeding. Bowel function is normal. Bladder function is normal. Patient is not sexually active. Contraception method is none. Postpartum depression screening: negative.  Pt is only pumping (not bringing baby to breast) given inverted nipples and difficulty with latch.   The pregnancy intention screening data noted above was reviewed. Potential methods of contraception were discussed. The patient elected to proceed with Abstinence until breastfeeding is well established. No h/o blood clots. No tobacco use. Pt prefers to wait on starting birth control given low milk supply. Pt ultimately prefers the Nuvaring.  Trich not previously treated in [facility]U on 04/27/20. Pt never received a message regarding this result.  Edinburgh Postnatal Depression Scale - 06/08/20 1453      Edinburgh Postnatal Depression Scale:  In the Past 7 Days   I have been able to laugh and see the funny side of things. 0    I have looked forward with enjoyment to things. 0    I have blamed myself unnecessarily when things went wrong. 0    I have been anxious or worried for no good reason. 0    I have felt scared or panicky for no good reason. 0    Things have been getting on top of me. 1    I have been so unhappy that I have had difficulty sleeping. 0    I have felt sad or miserable. 0    I have been so unhappy that I have been crying. 0    The thought of harming myself has occurred to me. 0    Edinburgh Postnatal Depression Scale Total 1          The following portions  of the patient's history were reviewed and updated as appropriate: allergies, current medications, past family history, past medical history, past social history, past surgical history and problem list.  Review of Systems Pertinent items noted in HPI and remainder of comprehensive ROS otherwise negative.    Objective:  BP 128/90   Pulse 86   Ht 5\' 5"  (1.651 m)   Wt 187 lb 3.2 oz (84.9 kg)   BMI 31.15 kg/m    General:  alert, cooperative and appears stated age   Breasts:  deferred  Lungs: normal work of breathing  Heart:  normal heart rate  Abdomen: soft, nontender   Vulva:  not evaluated  Vagina: not evaluated  Cervix:  not evaluated  Corpus: not examined  Adnexa:  not evaluated  Rectal Exam: Not performed.        Assessment:    Normal postpartum exam. Pap smear not done at today's visit given pt up-to-date.  Plan:   Essential components of care per ACOG recommendations:  1.  Mood and well being: Patient with negative depression screening today. Reviewed local resources for support.  - Patient does not use tobacco. - hx of drug use? No    2. Infant care and feeding:  -Patient currently breastmilk feeding? Yes (no plans to return to work in near future) -Social determinants of health (SDOH) reviewed in . No  concerns at this time.  3. Sexuality, contraception and birth spacing - Patient does not want a pregnancy in the next year.  Desired family size is undecided.  - Reviewed forms of contraception in tiered fashion. Patient desired abstinence today given difficulties with milk supply and currently not sexually active. Scheduled for f/u appt in 4 weeks to initiate birth control (pt is strongly considering Nuvaring). - Discussed birth spacing of 18 months  4. Sleep and fatigue -Encouraged family/partner/community support of 4 hrs of uninterrupted sleep to help with mood and fatigue  5. Physical Recovery  - Discussed patients delivery and complications - Patient had  a first degree laceration, perineal healing reviewed. Patient expressed understanding - Patient has urinary incontinence? No - Patient is safe to resume physical and sexual activity  6.  Health Maintenance - Last pap smear done 11/25/19 in [place] and was normal. (Viewed result on pt's MyChart App).  7. Chronic Disease - PCP follow up  8. Trichomonas Infection: Diagnosed on urine sample in [facility] on 04/27/20. Pt was never contacted regarding this result; no prior treatment. Counseled today and provided script for flagyl. Also called in to pt's preferred pharmacy for partner treatment. - flagyl 500mg  BID x7d (also prescribed for [facility]'s [facility]) - plan for test of cure in 4 weeks  Marirose Deveney, , MD OB Fellow, [facility] 06/09/2020 10:58 AM

## 2020-06-09 DIAGNOSIS — A599 Trichomoniasis, unspecified: Secondary | ICD-10-CM | POA: Insufficient documentation

## 2020-10-02 ENCOUNTER — Other Ambulatory Visit: Payer: Self-pay

## 2020-10-02 ENCOUNTER — Ambulatory Visit (HOSPITAL_COMMUNITY)
Admission: EM | Admit: 2020-10-02 | Discharge: 2020-10-02 | Disposition: A | Discharge status: Home / Self Care | Payer: Self-pay

## 2021-12-28 IMAGING — US US OB COMP LESS 14 WK
1 series · 14 of 28 positions shown · non-contrast
Comparison: None.

CLINICAL DATA: Rule out ectopic pregnancy. Pregnant patient with
pain.

EXAM:
OBSTETRIC <14 WK ULTRASOUND
TECHNIQUE: Transabdominal ultrasound was performed for evaluation of the
gestation as well as the maternal uterus and adnexal regions.

[Series 1: us ob comp less 14 wk · 57 acquisitions, 14 frames shown]
[im 3/57]
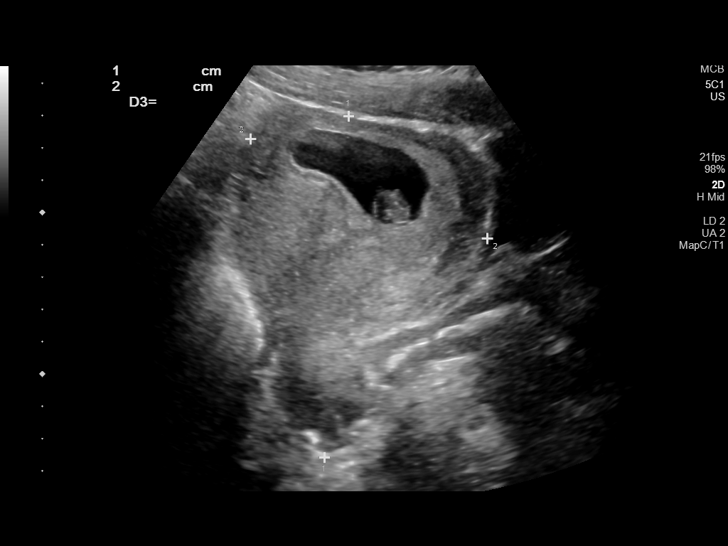
[im 7/57]
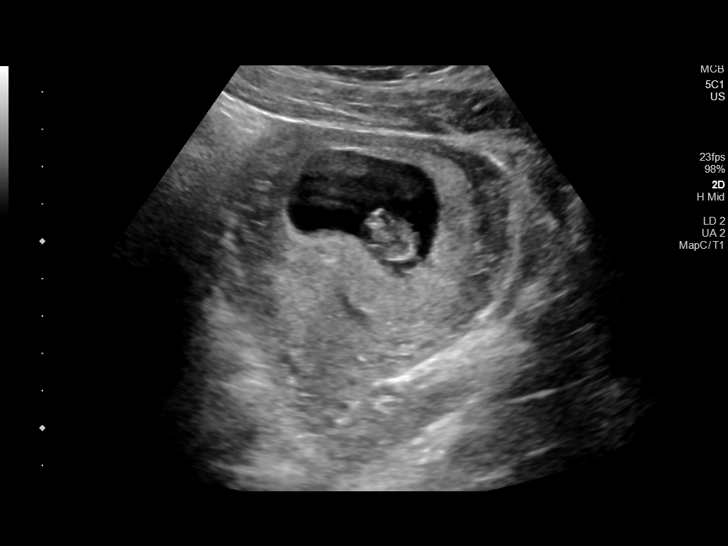
[im 11/57]
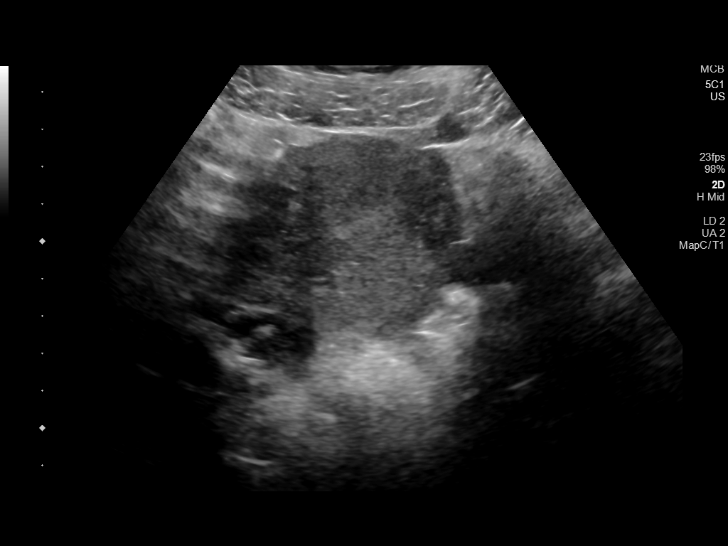
[im 15/57]
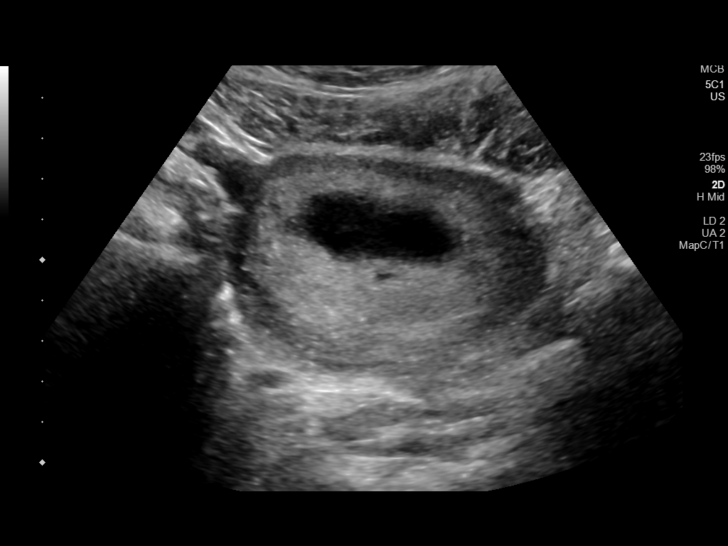
[im 19/57]
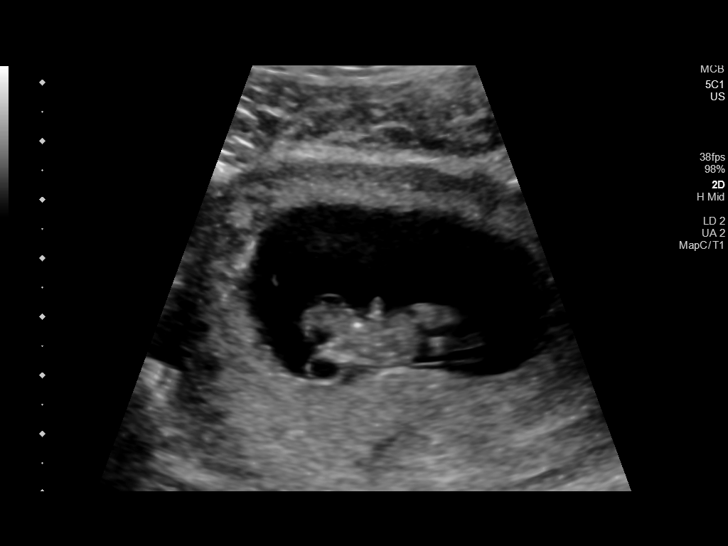
[im 23/57]
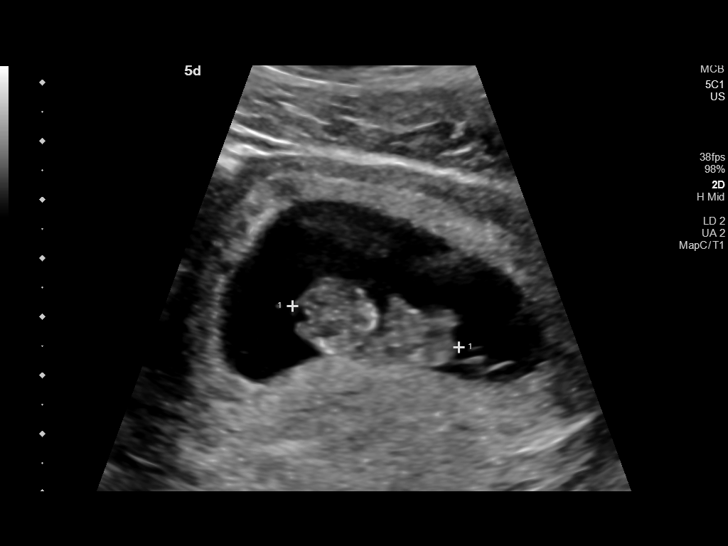
[im 27/57]
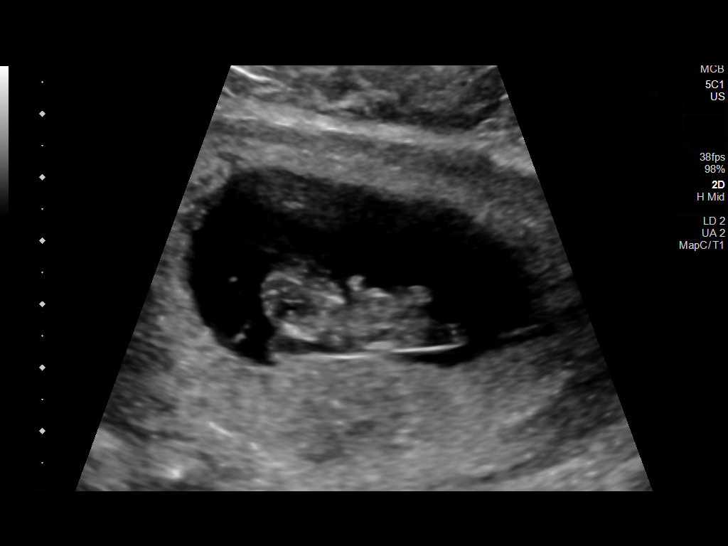
[im 32/57]
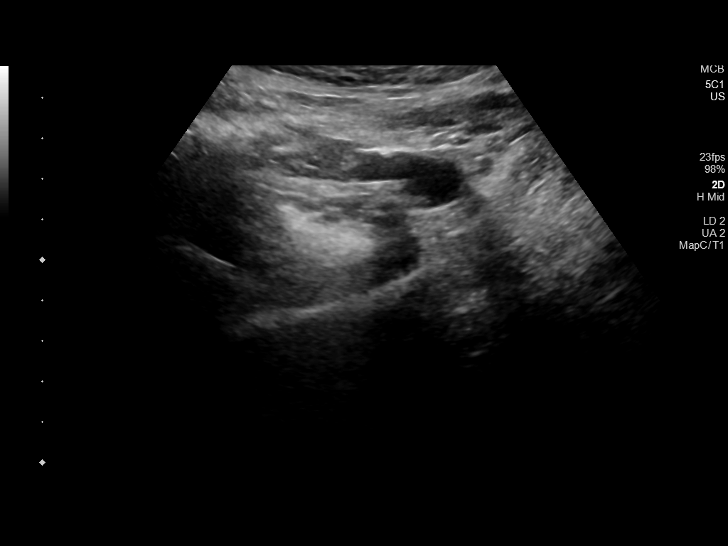
[im 36/57]
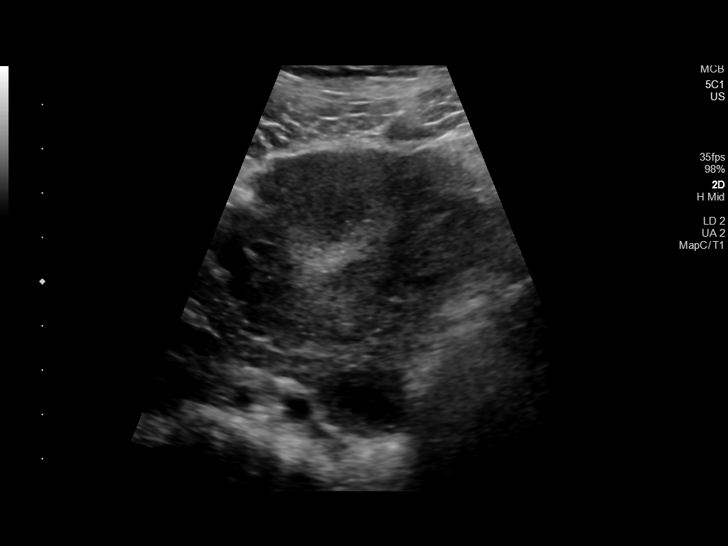
[im 40/57]
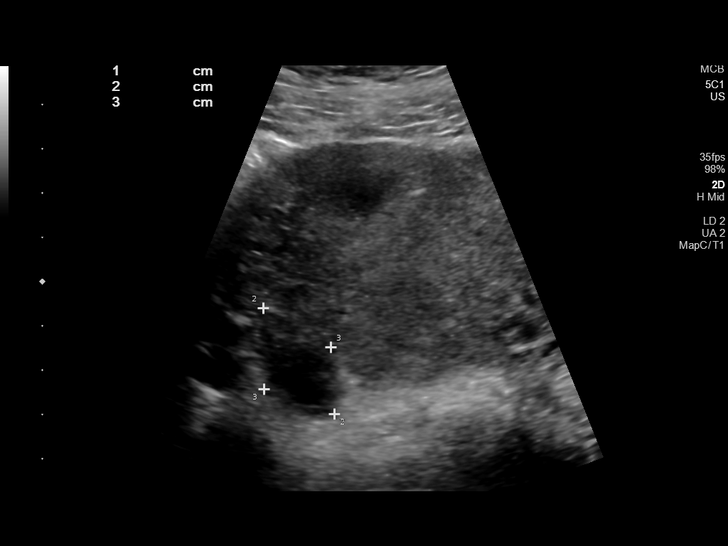
[im 44/57]
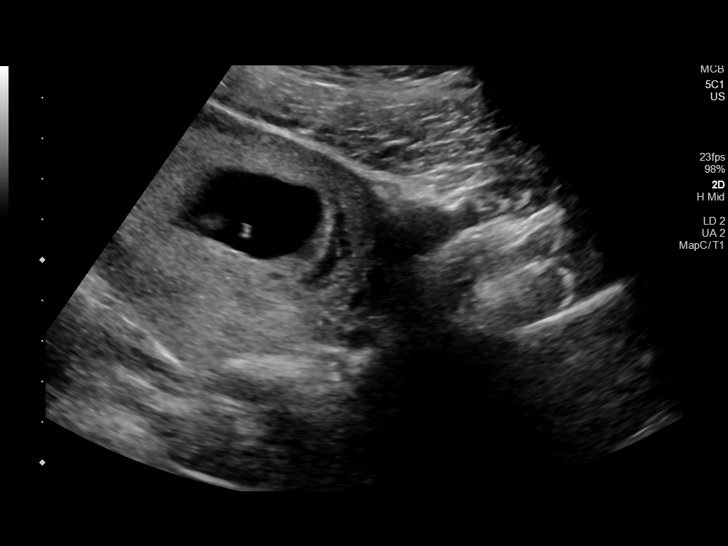
[im 48/57]
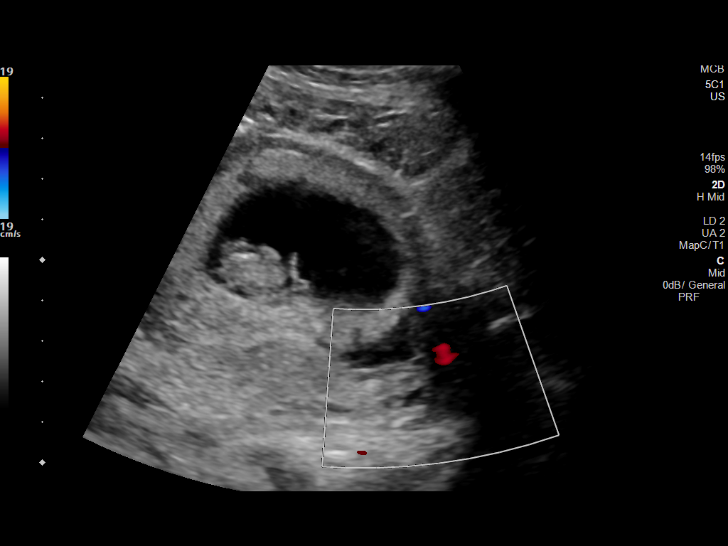
[im 52/57]
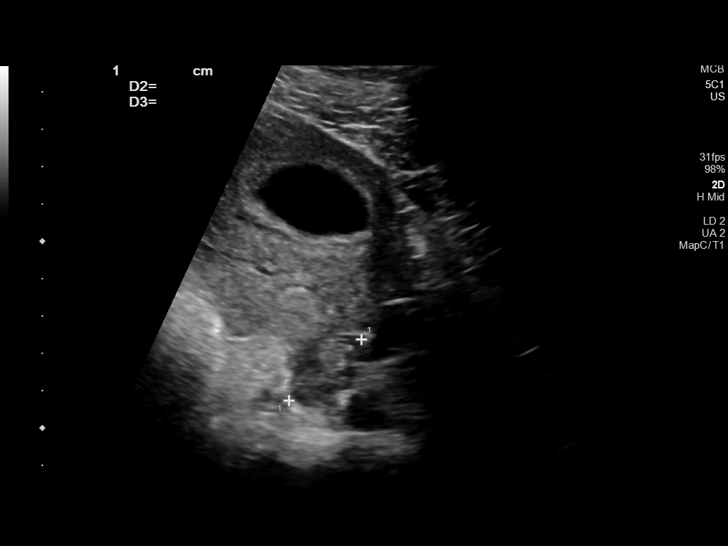
[im 57/57]
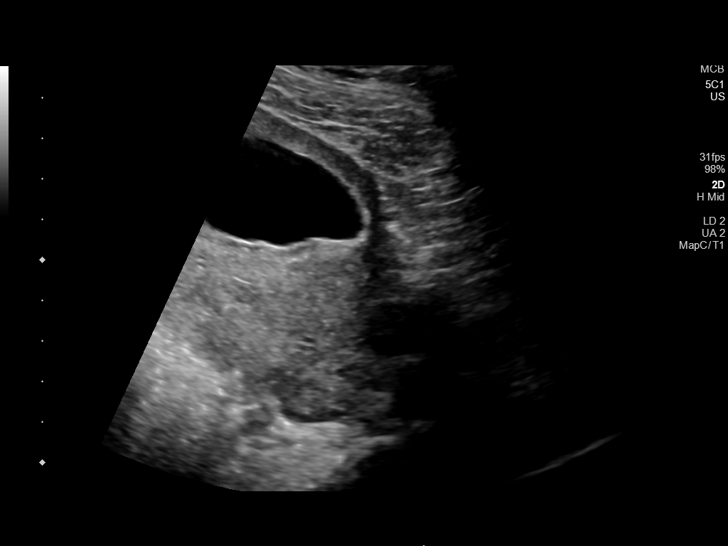

[14 of 28 positions shown; findings below may reference images not displayed]

FINDINGS: Intrauterine gestational sac: Single

Yolk sac:  Visualized.

Embryo:  Visualized.

Cardiac Activity: Visualized.

Heart Rate: 162 bpm

MSD:    mm    w     d

CRL:   28.7 mm   9 w 5 d                  US EDC: May 03, 2020

Subchorionic hemorrhage:  There is a small subchorionic hemorrhage.

Maternal uterus/adnexae: The left ovary is normal. There is a thick
walled cyst in the right ovary.
IMPRESSION: 1. Single live IUP.
2. There is a thick walled 1.9 cm cyst in the right ovary, likely a
corpus luteum cyst or hemorrhagic cyst given the presence of an IUP.

## 2022-06-21 ENCOUNTER — Encounter: Payer: Self-pay | Admitting: Obstetrics & Gynecology

## 2022-07-24 ENCOUNTER — Ambulatory Visit (INDEPENDENT_AMBULATORY_CARE_PROVIDER_SITE_OTHER): Payer: Self-pay | Admitting: Nurse Practitioner

## 2022-07-24 ENCOUNTER — Encounter: Payer: Self-pay | Admitting: Nurse Practitioner

## 2022-07-24 ENCOUNTER — Other Ambulatory Visit (HOSPITAL_COMMUNITY)
Admission: RE | Admit: 2022-07-24 | Discharge: 2022-07-24 | Disposition: A | Discharge status: Home / Self Care | Payer: PRIVATE HEALTH INSURANCE | Source: Ambulatory Visit | Attending: Nurse Practitioner | Admitting: Nurse Practitioner

## 2022-07-24 VITALS — BP 122/78 | HR 77 | Ht 65.0 in | Wt 234.0 lb

## 2022-07-24 DIAGNOSIS — Z113 Encounter for screening for infections with a predominantly sexual mode of transmission: Secondary | ICD-10-CM

## 2022-07-24 DIAGNOSIS — Z124 Encounter for screening for malignant neoplasm of cervix: Secondary | ICD-10-CM

## 2022-07-24 DIAGNOSIS — Z01419 Encounter for gynecological examination (general) (routine) without abnormal findings: Secondary | ICD-10-CM | POA: Diagnosis not present

## 2022-07-24 DIAGNOSIS — Z30015 Encounter for initial prescription of vaginal ring hormonal contraceptive: Secondary | ICD-10-CM

## 2022-07-24 MED ORDER — ETONOGESTREL-ETHINYL ESTRADIOL 0.12-0.015 MG/24HR VA RING: 1.0000 | VAGINAL_RING | VAGINAL | 3 refills | Status: DC

## 2022-07-24 NOTE — Progress Notes (Signed)
   Tiasha Helvie [facility] KZLD [birth year] [id number]   History:  27 y.o. G1P1001 presents as new patient to establish care. No GYN complaints. Just had her first period since having her daughter 2 years ago. Breastfeeding. Not currently sexually active. Wants to restart Nuvaring. Husband is in [employer]. Would like STD screening, declines HIV/RPR. Normal pap history.  Gynecologic History Patient's last menstrual period was 07/09/2022.   Contraception/Family planning: none Sexually active: Yes  Health Maintenance Last Pap: UNK. Results were: Normal per patient Last mammogram: Not indicated Last colonoscopy: Not indicated Last Dexa: Not indicated   Past medical history, past surgical history, family history and social history were all reviewed and documented in the EPIC chart. 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 From [place].   ROS:  A ROS was performed and pertinent positives and negatives are included.  Exam:  Vitals:   07/24/22 1613  BP: 122/78  Pulse: 77  Weight: 234 lb (106.1 kg)  Height: 5\' 5"  (1.651 m)   Body mass index is 38.94 kg/m.  General appearance:  Normal Thyroid:  Symmetrical, normal in size, without palpable masses or nodularity. Respiratory  Auscultation:  Clear without wheezing or rhonchi Cardiovascular  Auscultation:  Regular rate, without rubs, murmurs or gallops  Edema/varicosities:  Not grossly evident Abdominal  Soft,nontender, without masses, guarding or rebound.  Liver/spleen:  No organomegaly noted  Hernia:  None appreciated  Skin  Inspection:  Grossly normal Breasts: Examined lying and sitting.   Right: Without masses, retractions, nipple discharge or axillary adenopathy.   Left: Without masses, retractions, nipple discharge or axillary adenopathy. Genitourinary   Inguinal/mons:  Normal without inguinal adenopathy  External genitalia:  Normal appearing vulva with no masses, tenderness, or lesions  BUS/Urethra/Skene's glands:  Normal  Vagina:  Normal  appearing with normal color and discharge, no lesions  Cervix:  Normal appearing without discharge or lesions  Uterus:  Difficult to palpate due to body habitus but no gross masses or tenderness  Adnexa/parametria:     Rt: Normal in size, without masses or tenderness.   Lt: Normal in size, without masses or tenderness.  Anus and perineum: Normal  Digital rectal exam: Deferred  Patient informed chaperone available to be present for breast and pelvic exam. Patient has requested no chaperone to be present. Patient has been advised what will be completed during breast and pelvic exam.   Assessment/Plan:  27 y.o. G1P1001 to establish care.   Well female exam with routine gynecological exam - Education provided on SBEs, importance of preventative screenings, current guidelines, high calcium diet, regular exercise, and multivitamin daily. PCP in [place].   Screening examination for STD (sexually transmitted disease) - Plan: Cytology - PAP( [place]). GC/CT/trich added to pap.   Encounter for initial prescription of vaginal ring hormonal contraceptive - Plan: etonogestrel-ethinyl estradiol (NUVARING) 0.12-0.015 MG/24HR vaginal ring every 28 days. Aware of proper use.   Screening for cervical cancer - Plan: Cytology - PAP( [place]). Normal pap history.   Return in 1 year for annual.     Tamela Gammon DNP, 4:20 PM 07/24/2022

## 2022-07-27 ENCOUNTER — Encounter: Payer: Self-pay | Admitting: Nurse Practitioner

## 2022-07-27 DIAGNOSIS — Z30015 Encounter for initial prescription of vaginal ring hormonal contraceptive: Secondary | ICD-10-CM

## 2022-07-27 DIAGNOSIS — A599 Trichomoniasis, unspecified: Secondary | ICD-10-CM

## 2022-07-27 LAB — CYTOLOGY - PAP
Adequacy: ABSENT
Chlamydia: NEGATIVE
Comment: NEGATIVE
Comment: NEGATIVE
Comment: NORMAL
Diagnosis: NEGATIVE
Neisseria Gonorrhea: NEGATIVE
Trichomonas: POSITIVE — AB

## 2022-07-28 MED ORDER — METRONIDAZOLE 500 MG PO TABS: 500.0000 mg | ORAL_TABLET | Freq: Two times a day (BID) | ORAL | 0 refills | Status: DC

## 2022-07-28 NOTE — Telephone Encounter (Signed)
Flagyl 575m PO BID x 7 days partner needs treament TOC 6 weeks

## 2022-07-31 NOTE — Telephone Encounter (Signed)
Pt scheduled for TOC on 09/04/2022.

## 2022-08-18 MED ORDER — ETONOGESTREL-ETHINYL ESTRADIOL 0.12-0.015 MG/24HR VA RING: 1.0000 | VAGINAL_RING | VAGINAL | 0 refills | Status: AC

## 2022-08-18 MED ORDER — METRONIDAZOLE 500 MG PO TABS: 500.0000 mg | ORAL_TABLET | Freq: Two times a day (BID) | ORAL | 0 refills | Status: AC

## 2022-08-18 NOTE — Addendum Note (Signed)
Addended by: Judy Pimple D on: 08/18/2022 08:38 AM   Modules accepted: Orders

## 2022-09-04 ENCOUNTER — Ambulatory Visit: Payer: PRIVATE HEALTH INSURANCE | Admitting: Nurse Practitioner

## 2022-10-16 ENCOUNTER — Ambulatory Visit: Payer: PRIVATE HEALTH INSURANCE | Admitting: Nurse Practitioner

## 2022-10-16 NOTE — Progress Notes (Deleted)
   Acute Office Visit  Subjective:    Patient ID: Tiffany Fitzpatrick, female    DOB: 1996/04/13, 27 y.o.   MRN: [id number]   HPI 27 y.o. presents today for TOC. + trich 07/24/2022.   No LMP recorded. (Menstrual status: Lactating).    Review of Systems     Objective:    Physical Exam  There were no vitals taken for this visit. Wt Readings from Last 3 Encounters:  07/24/22 234 lb (106.1 kg)  06/08/20 187 lb 3.2 oz (84.9 kg)  05/06/20 203 lb (92.1 kg)        Patient informed chaperone available to be present for breast and/or pelvic exam. Patient has requested no chaperone to be present. Patient has been advised what will be completed during breast and pelvic exam.   Assessment & Plan:   Problem List Items Addressed This Visit   None       [name] DNP, 12:14 PM 10/16/2022

## 2022-11-07 ENCOUNTER — Encounter: Payer: Self-pay | Admitting: Nurse Practitioner

## 2022-11-07 ENCOUNTER — Ambulatory Visit (INDEPENDENT_AMBULATORY_CARE_PROVIDER_SITE_OTHER): Payer: PRIVATE HEALTH INSURANCE | Admitting: Nurse Practitioner

## 2022-11-07 VITALS — BP 126/82 | HR 65

## 2022-11-07 DIAGNOSIS — A599 Trichomoniasis, unspecified: Secondary | ICD-10-CM

## 2022-11-07 NOTE — Progress Notes (Signed)
   Acute Office Visit  Subjective:    Patient ID: Tiffany Fitzpatrick, female    DOB: 11/13/1995, 27 y.o.   MRN: [id number]   HPI 27 y.o. presents today for TOC. + Trich 07/24/2022. Completed full course of antibiotics. Partner lives in [place], reports he was treated but she is not for sure. They have been sexually active since. Denies symptoms.   Review of Systems  Constitutional: Negative.   Genitourinary: Negative.        Objective:    Physical Exam Constitutional:      Appearance: Normal appearance.  Genitourinary:    General: Normal vulva.     Vagina: Normal.     Cervix: Normal.     BP 126/82 (BP Location: Right Arm, Patient Position: Sitting, Cuff Size: Large)   Pulse 65   LMP 10/11/2022   SpO2 97%  Wt Readings from Last 3 Encounters:  07/24/22 234 lb (106.1 kg)  06/08/20 187 lb 3.2 oz (84.9 kg)  05/06/20 203 lb (92.1 kg)        Patient informed chaperone available to be present for breast and/or pelvic exam. Patient has requested no chaperone to be present. Patient has been advised what will be completed during breast and pelvic exam.   Assessment & Plan:   Problem List Items Addressed This Visit       Other   Trichomonas infection - Primary   Relevant Orders   SURESWAB CT/NG/T. vaginalis   Plan: STD panel pending.      [name] DNP, 3:30 PM 11/07/2022

## 2022-11-08 LAB — SURESWAB CT/NG/T. VAGINALIS
C. trachomatis RNA, TMA: NOT DETECTED
N. gonorrhoeae RNA, TMA: NOT DETECTED
Trichomonas vaginalis RNA: NOT DETECTED

## 2023-04-24 ENCOUNTER — Ambulatory Visit (INDEPENDENT_AMBULATORY_CARE_PROVIDER_SITE_OTHER): Payer: PRIVATE HEALTH INSURANCE | Admitting: Podiatry

## 2023-04-24 DIAGNOSIS — Z91199 Patient's noncompliance with other medical treatment and regimen due to unspecified reason: Secondary | ICD-10-CM

## 2023-04-24 NOTE — Progress Notes (Signed)
No show

## 2023-05-02 ENCOUNTER — Ambulatory Visit (INDEPENDENT_AMBULATORY_CARE_PROVIDER_SITE_OTHER): Payer: Self-pay | Admitting: Podiatry

## 2023-05-02 ENCOUNTER — Encounter: Payer: Self-pay | Admitting: Podiatry

## 2023-05-02 DIAGNOSIS — M2042 Other hammer toe(s) (acquired), left foot: Secondary | ICD-10-CM

## 2023-05-02 DIAGNOSIS — M2041 Other hammer toe(s) (acquired), right foot: Secondary | ICD-10-CM | POA: Diagnosis not present

## 2023-05-02 NOTE — Progress Notes (Signed)
  Subjective:  Patient ID: Tiffany Fitzpatrick, female    DOB: 05-09-1996,   MRN: [id number]  No chief complaint on file.   27 y.o. female presents for concern of lesions on the tops of her toes. Relates they are discolored and ugly. Relates they can be painful in certain shoes. Has not tried any specific treatments. . Denies any other pedal complaints. Denies n/v/f/c.   Past Medical History:  Diagnosis Date   Medical history non-contributory    Trichomonas infection     Objective:  Physical Exam: Vascular: DP/PT pulses 2/4 bilateral. CFT <3 seconds. Normal hair growth on digits. No edema.  Skin. No lacerations or abrasions bilateral feet.  Musculoskeletal: MMT 5/5 bilateral lower extremities in DF, PF, Inversion and Eversion. Deceased ROM in DF of ankle joint. Hammered digits 2-5 bilateral with flexibility noted and semi rigid in bilateral second toes.  Neurological: Sensation intact to light touch.   Assessment:   1. Hammertoe, bilateral      Plan:  Patient was evaluated and treated and all questions answered. -Educated on hammertoes and treatment options  -Discussed padding including toe caps and crest pads.  -Discussed need for potential surgery if pain does not improved.  -Did discuss there is no guarantee that the discoloration on her toes would improve with surgery and that we don't do cosmetic procedures for this.  -Patient to follow-up as needed. Discussed calling if any changes or increased pain.     Louann Sjogren, DPM

## 2023-09-10 ENCOUNTER — Emergency Department (HOSPITAL_COMMUNITY)
Admission: EM | Admit: 2023-09-10 | Discharge: 2023-09-10 | Disposition: A | Discharge status: Home / Self Care | Payer: Self-pay | Attending: Emergency Medicine | Admitting: Emergency Medicine

## 2023-09-10 ENCOUNTER — Other Ambulatory Visit: Payer: Self-pay

## 2023-09-10 DIAGNOSIS — R21 Rash and other nonspecific skin eruption: Secondary | ICD-10-CM

## 2023-09-10 LAB — CBC
HCT: 43.8 % (ref 36.0–46.0)
Hemoglobin: 13.9 g/dL (ref 12.0–15.0)
MCH: 28.7 pg (ref 26.0–34.0)
MCHC: 31.7 g/dL (ref 30.0–36.0)
MCV: 90.5 fL (ref 80.0–100.0)
Platelets: 322 10*3/uL (ref 150–400)
RBC: 4.84 MIL/uL (ref 3.87–5.11)
RDW: 12.8 % (ref 11.5–15.5)
WBC: 6.2 10*3/uL (ref 4.0–10.5)
nRBC: 0 % (ref 0.0–0.2)

## 2023-09-10 LAB — RAPID HIV SCREEN (HIV 1/2 AB+AG)
HIV 1/2 Antibodies: NONREACTIVE
HIV-1 P24 Antigen - HIV24: NONREACTIVE

## 2023-09-10 LAB — HEPATITIS PANEL, ACUTE
HCV Ab: NONREACTIVE
Hep A IgM: NONREACTIVE
Hep B C IgM: NONREACTIVE
Hepatitis B Surface Ag: NONREACTIVE

## 2023-09-10 LAB — BASIC METABOLIC PANEL WITH GFR
Anion gap: 9 (ref 5–15)
BUN: 8 mg/dL (ref 6–20)
CO2: 23 mmol/L (ref 22–32)
Calcium: 9.3 mg/dL (ref 8.9–10.3)
Chloride: 107 mmol/L (ref 98–111)
Creatinine, Ser: 0.86 mg/dL (ref 0.44–1.00)
GFR, Estimated: >60 mL/min (ref >60)
Glucose, Bld: 103 mg/dL — ABNORMAL HIGH (ref 70–99)
Potassium: 4.1 mmol/L (ref 3.5–5.1)
Sodium: 139 mmol/L (ref 135–145)

## 2023-09-10 MED ORDER — AQUAPHOR EX OINT: TOPICAL_OINTMENT | CUTANEOUS | 0 refills | Status: AC | PRN

## 2023-09-10 MED ORDER — HYDROCORTISONE 1 % EX CREA: TOPICAL_CREAM | CUTANEOUS | 0 refills | Status: AC

## 2023-09-10 MED ORDER — CEPHALEXIN 500 MG PO CAPS: 500.0000 mg | ORAL_CAPSULE | Freq: Three times a day (TID) | ORAL | 0 refills | Status: AC

## 2023-09-10 NOTE — Discharge Instructions (Signed)
 Starting the antibiotics, Keflex as prescribed.  Alternate between hydrocortisone cream and Aquaphor twice daily  Follow-up on your results of your HIV, hepatitis on MyChart  Return for new or worsening symptoms

## 2023-09-10 NOTE — ED Provider Notes (Signed)
 [place] EMERGENCY DEPARTMENT AT [facility] Provider Note   CSN: [id number] Arrival date & time: 09/10/23  1342    History  Chief Complaint  Patient presents with   Skin Problem    Tiffany Fitzpatrick is a 28 y.o. female here for evaluation of abnormality to left upper extremity.  She states she had a new tattoo about 3 weeks ago.  Since then she has had bumps to the area.  Initially pruritic and nontender.  No fever.  She feels a "hard" spot on the volar aspect of the tattoo approximately 1 cm.  States she has had many tattoos before and has never had this issue before she did get tattooed by a new tattoo artist.  She is also concerned for hepatitis and HIV given the tattoo parlor.   HPI     Home Medications Prior to Admission medications   Medication Sig Start Date End Date Taking? Authorizing Provider  cephALEXin (KEFLEX) 500 MG capsule Take 1 capsule (500 mg total) by mouth 3 (three) times daily for 5 days. 09/10/23 09/15/23 Yes Sakara Lehtinen A, PA-C  hydrocortisone cream 1 % Apply to affected area 2 times daily 09/10/23  Yes Pietra Zuluaga A, PA-C  mineral oil-hydrophilic petrolatum (AQUAPHOR) ointment Apply topically as needed for dry skin. 09/10/23  Yes Jerelene Salaam A, PA-C  acetaminophen (TYLENOL) 325 MG tablet Take 2 tablets (650 mg total) by mouth every 4 (four) hours as needed (for pain scale < 4). 05/09/20   Autry-Lott, Randa Evens, DO  etonogestrel-ethinyl estradiol (NUVARING) 0.12-0.015 MG/24HR vaginal ring Place 1 each vaginally every 28 (twenty-eight) days. Insert vaginally and leave in place for 3 consecutive weeks, then remove for 1 week. 08/18/22   [name], NP  Prenatal Vit-Fe Fumarate-FA (PRENATAL VITAMINS PO) Take 1 tablet by mouth daily.    [provider]      Allergies    Patient has no known allergies.    Review of Systems   Review of Systems  Constitutional: Negative.   Genitourinary: Negative.   Musculoskeletal:  Negative.   Skin:        Rash left forearm  Neurological: Negative.   All other systems reviewed and are negative.   Physical Exam Updated Vital Signs BP 115/78 (BP Location: Right Arm)   Pulse 84   Temp 98.4 F (36.9 C) (Oral)   Resp 18   Ht 5\' 5"  (1.651 m)   Wt 108.9 kg   LMP 08/24/2023   SpO2 98%   Breastfeeding No   BMI 39.94 kg/m  Physical Exam Vitals and nursing note reviewed.  Constitutional:      General: She is not in acute distress.    Appearance: She is well-developed. She is not ill-appearing.  HENT:     Head: Atraumatic.  Eyes:     Pupils: Pupils are equal, round, and reactive to light.  Cardiovascular:     Rate and Rhythm: Normal rate.  Pulmonary:     Effort: No respiratory distress.  Abdominal:     General: There is no distension.  Musculoskeletal:        General: Normal range of motion.     Cervical back: Normal range of motion.     Comments: No bony tenderness compartments soft.  No joint effusion.  Full range of motion.  Skin:    General: Skin is warm and dry.     Capillary Refill: Capillary refill takes less than 2 seconds.     Comments: Erythematous  papules left forearm under tattoo location.  No target lesion, bulla, fluctuance, induration, desquamated skin.  Neurological:     General: No focal deficit present.     Mental Status: She is alert.  Psychiatric:        Mood and Affect: Mood normal.     ED Results / Procedures / Treatments   Labs (all labs ordered are listed, but only abnormal results are displayed) Labs Reviewed  BASIC METABOLIC PANEL WITH GFR - Abnormal; Notable for the following components:      Result Value   Glucose, Bld 103 (*)    All other components within normal limits  CBC  RAPID HIV SCREEN (HIV 1/2 AB+AG)  HEPATITIS PANEL, ACUTE    EKG None  Radiology No results found.  Procedures Procedures    Medications Ordered in ED Medications - No data to display  ED Course/ Medical Decision Making/ A&P    28 year old here for evaluation of rash to left forearm after having tattoo placed 3 weeks ago.  Patient has many tattoos and has never had this happen previously.  Initially pruritic now tender.  She has erythematous papules underlying her tattoo.  She is also concerned about infectious disease given the tattoo parlor.  She is asymptomatic as far as ID concerns.  I suspect she likely has contact dermatitis however she does have some mild warmth.  Will cover for infectious process, no history of MRSA.  Patient started on topical hydrocortisone, Aquaphor as well as Keflex.  Labs obtained from triage personally viewed interpreted HIV, hepatitis panel pending--patient will follow-up on MyChart CBC without leukocytosis 9 metabolic panel without significant abnormality  Do not feel patient needs additional labs or imaging at this time.  Low suspicion for abscess, compartment syndrome, deep space infection, blisters, no pustules, no warmth, no draining sinus tracts, no superficial abscesses, no bullous impetigo, no vesicles, no desquamation, no target lesions with dusky purpura or a central bulla. Not tender to touch. No concern for superimposed infection. No concern for SJS, TEN, TSS, tick borne illness, syphilis or other life-threatening condition.                                 Medical Decision Making Amount and/or Complexity of Data Reviewed External Data Reviewed: labs and notes. Labs: ordered. Decision-making details documented in ED Course.  Risk OTC drugs. Prescription drug management. Decision regarding hospitalization. Diagnosis or treatment significantly limited by social determinants of health.         Final Clinical Impression(s) / ED Diagnoses Final diagnoses:  Rash    Rx / DC Orders ED Discharge Orders          Ordered    cephALEXin (KEFLEX) 500 MG capsule  3 times daily        09/10/23 1541    mineral oil-hydrophilic petrolatum (AQUAPHOR) ointment  As needed         09/10/23 1541    hydrocortisone cream 1 %        09/10/23 1541              Aquarius Latouche A, PA-C 09/10/23 1551    Rondel Baton, MD 09/11/23 1415

## 2023-09-10 NOTE — ED Triage Notes (Signed)
 Pt received a tattoo on her L forearm 3 weeks ago. There area has remained painful and bumpy. The area is indurated and tender to the touch.

## 2023-09-11 ENCOUNTER — Encounter: Payer: Self-pay | Admitting: Nurse Practitioner

## 2023-09-18 ENCOUNTER — Ambulatory Visit: Payer: Self-pay | Admitting: Nurse Practitioner

## 2023-09-18 NOTE — Progress Notes (Deleted)
   Tiffany Fitzpatrick [facility] RUEA 1995-07-04 [id number]   History:  28 y.o. G1P1001 presents for annual exam. Nuvaring. Normal pap history.  Gynecologic History Patient's last menstrual period was 08/24/2023.   Contraception/Family planning: none Sexually active: Yes  Health Maintenance Last Pap: 07/24/2022. Results were: Normal Last mammogram: Not indicated Last colonoscopy: Not indicated Last Dexa: Not indicated   Past medical history, past surgical history, family history and social history were all reviewed and documented in the EPIC chart. Married. Husband is in [employer]. SAHM. 2 yo daughter. From [place].   ROS:  A ROS was performed and pertinent positives and negatives are included.  Exam:  There were no vitals filed for this visit.  There is no height or weight on file to calculate BMI.  General appearance:  Normal Thyroid:  Symmetrical, normal in size, without palpable masses or nodularity. Respiratory  Auscultation:  Clear without wheezing or rhonchi Cardiovascular  Auscultation:  Regular rate, without rubs, murmurs or gallops  Edema/varicosities:  Not grossly evident Abdominal  Soft,nontender, without masses, guarding or rebound.  Liver/spleen:  No organomegaly noted  Hernia:  None appreciated  Skin  Inspection:  Grossly normal Breasts: Examined lying and sitting.   Right: Without masses, retractions, nipple discharge or axillary adenopathy.   Left: Without masses, retractions, nipple discharge or axillary adenopathy. Pelvic: External genitalia:  no lesions              Urethra:  normal appearing urethra with no masses, tenderness or lesions              Bartholins and Skenes: normal                 Vagina: normal appearing vagina with normal color and discharge, no lesions              Cervix: no lesions Bimanual Exam:  Uterus:  no masses or tenderness              Adnexa: no mass, fullness, tenderness              Rectovaginal: Deferred              Anus:  normal,  no lesions  Patient informed chaperone available to be present for breast and pelvic exam. Patient has requested no chaperone to be present. Patient has been advised what will be completed during breast and pelvic exam.   Assessment/Plan:  28 y.o. G1P1001 for annual exam.   Well female exam with routine gynecological exam - Education provided on SBEs, importance of preventative screenings, current guidelines, high calcium diet, regular exercise, and multivitamin daily. PCP in [place].   Encounter for initial prescription of vaginal ring hormonal contraceptive - Plan: etonogestrel-ethinyl estradiol (NUVARING) 0.12-0.015 MG/24HR vaginal ring every 28 days. Aware of proper use.   Screening for cervical cancer - Normal pap history. Will repeat at 3-year interval per guidelines.   No follow-ups on file.    Tiffany Mackie DNP, 1:43 PM 09/18/2023

## 2023-10-18 ENCOUNTER — Ambulatory Visit (INDEPENDENT_AMBULATORY_CARE_PROVIDER_SITE_OTHER): Payer: PRIVATE HEALTH INSURANCE | Admitting: Obstetrics and Gynecology
